# Patient Record
Sex: Female | Born: 1965 | Race: White | Hispanic: No | Smoking: Former smoker
Health system: Southern US, Community
[De-identification: ages and names within clinical notes are randomized; demographics above are authoritative.]

## PROBLEM LIST (undated history)

## (undated) DIAGNOSIS — K219 Gastro-esophageal reflux disease without esophagitis: Secondary | ICD-10-CM

## (undated) DIAGNOSIS — D649 Anemia, unspecified: Secondary | ICD-10-CM

## (undated) DIAGNOSIS — I10 Essential (primary) hypertension: Secondary | ICD-10-CM

## (undated) DIAGNOSIS — K200 Eosinophilic esophagitis: Secondary | ICD-10-CM

## (undated) DIAGNOSIS — K859 Acute pancreatitis without necrosis or infection, unspecified: Secondary | ICD-10-CM

## (undated) DIAGNOSIS — N200 Calculus of kidney: Secondary | ICD-10-CM

## (undated) HISTORY — PX: MOUTH SURGERY: SHX715

## (undated) HISTORY — PX: BREAST LUMPECTOMY: SHX2

## (undated) HISTORY — PX: ABDOMINAL HYSTERECTOMY: SHX81

## (undated) HISTORY — PX: ANKLE RECONSTRUCTION: SHX1151

## (undated) HISTORY — PX: APPENDECTOMY: SHX54

## (undated) HISTORY — PX: CHOLECYSTECTOMY: SHX55

## (undated) HISTORY — PX: TONSILLECTOMY: SUR1361

## (undated) HISTORY — PX: WRIST RECONSTRUCTION: SHX2675

---

## 1999-11-18 ENCOUNTER — Encounter: Payer: Self-pay | Admitting: Emergency Medicine

## 1999-11-18 ENCOUNTER — Emergency Department (HOSPITAL_COMMUNITY): Admission: EM | Admit: 1999-11-18 | Discharge: 1999-11-18 | Payer: Self-pay | Admitting: Emergency Medicine

## 2000-01-18 ENCOUNTER — Emergency Department (HOSPITAL_COMMUNITY): Admission: EM | Admit: 2000-01-18 | Discharge: 2000-01-18 | Payer: Self-pay | Admitting: Emergency Medicine

## 2000-01-19 ENCOUNTER — Encounter: Payer: Self-pay | Admitting: Emergency Medicine

## 2000-01-30 ENCOUNTER — Ambulatory Visit (HOSPITAL_COMMUNITY): Admission: RE | Admit: 2000-01-30 | Discharge: 2000-01-30 | Payer: Self-pay

## 2000-02-05 ENCOUNTER — Encounter: Payer: Self-pay | Admitting: Emergency Medicine

## 2000-02-05 ENCOUNTER — Emergency Department (HOSPITAL_COMMUNITY): Admission: EM | Admit: 2000-02-05 | Discharge: 2000-02-05 | Payer: Self-pay | Admitting: Emergency Medicine

## 2000-06-17 ENCOUNTER — Emergency Department (HOSPITAL_COMMUNITY): Admission: EM | Admit: 2000-06-17 | Discharge: 2000-06-17 | Payer: Self-pay | Admitting: *Deleted

## 2000-12-17 ENCOUNTER — Encounter: Payer: Self-pay | Admitting: Emergency Medicine

## 2000-12-17 ENCOUNTER — Emergency Department (HOSPITAL_COMMUNITY): Admission: EM | Admit: 2000-12-17 | Discharge: 2000-12-17 | Payer: Self-pay | Admitting: Emergency Medicine

## 2001-05-10 ENCOUNTER — Emergency Department (HOSPITAL_COMMUNITY): Admission: EM | Admit: 2001-05-10 | Discharge: 2001-05-10 | Payer: Self-pay | Admitting: Emergency Medicine

## 2002-07-07 ENCOUNTER — Encounter: Payer: Self-pay | Admitting: Unknown Physician Specialty

## 2002-07-07 ENCOUNTER — Encounter: Admission: RE | Admit: 2002-07-07 | Discharge: 2002-07-07 | Payer: Self-pay | Admitting: Unknown Physician Specialty

## 2005-05-14 HISTORY — PX: LUMBAR DISC SURGERY: SHX700

## 2006-05-14 DIAGNOSIS — N200 Calculus of kidney: Secondary | ICD-10-CM

## 2006-05-14 HISTORY — DX: Calculus of kidney: N20.0

## 2006-06-16 ENCOUNTER — Emergency Department (HOSPITAL_COMMUNITY): Admission: EM | Admit: 2006-06-16 | Discharge: 2006-06-16 | Payer: Self-pay | Admitting: Emergency Medicine

## 2006-06-19 ENCOUNTER — Emergency Department (HOSPITAL_COMMUNITY): Admission: EM | Admit: 2006-06-19 | Discharge: 2006-06-19 | Payer: Self-pay | Admitting: Emergency Medicine

## 2006-10-04 ENCOUNTER — Emergency Department (HOSPITAL_COMMUNITY): Admission: EM | Admit: 2006-10-04 | Discharge: 2006-10-04 | Payer: Self-pay | Admitting: Emergency Medicine

## 2008-02-06 ENCOUNTER — Emergency Department: Payer: Self-pay | Admitting: Emergency Medicine

## 2008-03-12 ENCOUNTER — Emergency Department: Payer: Self-pay | Admitting: Emergency Medicine

## 2008-06-28 ENCOUNTER — Ambulatory Visit: Payer: Self-pay | Admitting: Orthopedic Surgery

## 2009-01-30 ENCOUNTER — Emergency Department: Payer: Self-pay | Admitting: Unknown Physician Specialty

## 2009-09-12 ENCOUNTER — Emergency Department (HOSPITAL_COMMUNITY): Admission: EM | Admit: 2009-09-12 | Discharge: 2009-09-12 | Payer: Self-pay | Admitting: Emergency Medicine

## 2010-02-07 ENCOUNTER — Encounter
Admission: RE | Admit: 2010-02-07 | Discharge: 2010-05-08 | Payer: Self-pay | Source: Home / Self Care | Attending: Specialist | Admitting: Specialist

## 2010-07-04 ENCOUNTER — Emergency Department (HOSPITAL_COMMUNITY)
Admission: EM | Admit: 2010-07-04 | Discharge: 2010-07-04 | Disposition: A | Discharge status: Home / Self Care | Payer: Self-pay | Attending: Emergency Medicine | Admitting: Emergency Medicine

## 2010-07-04 DIAGNOSIS — S139XXA Sprain of joints and ligaments of unspecified parts of neck, initial encounter: Secondary | ICD-10-CM | POA: Insufficient documentation

## 2010-07-04 DIAGNOSIS — X500XXA Overexertion from strenuous movement or load, initial encounter: Secondary | ICD-10-CM | POA: Insufficient documentation

## 2010-07-04 DIAGNOSIS — Y92009 Unspecified place in unspecified non-institutional (private) residence as the place of occurrence of the external cause: Secondary | ICD-10-CM | POA: Insufficient documentation

## 2010-07-04 DIAGNOSIS — M25519 Pain in unspecified shoulder: Secondary | ICD-10-CM | POA: Insufficient documentation

## 2010-07-04 DIAGNOSIS — Y9383 Activity, rough housing and horseplay: Secondary | ICD-10-CM | POA: Insufficient documentation

## 2010-08-01 LAB — URINALYSIS, ROUTINE W REFLEX MICROSCOPIC
Bilirubin Urine: NEGATIVE
Glucose, UA: NEGATIVE mg/dL
Hgb urine dipstick: NEGATIVE
Ketones, ur: NEGATIVE mg/dL
Nitrite: NEGATIVE
Protein, ur: NEGATIVE mg/dL
Specific Gravity, Urine: 1.025 (ref 1.005–1.030)
Urobilinogen, UA: 0.2 mg/dL (ref 0.0–1.0)
pH: 6 (ref 5.0–8.0)

## 2011-05-24 ENCOUNTER — Ambulatory Visit: Payer: Worker's Compensation

## 2011-05-29 ENCOUNTER — Ambulatory Visit: Payer: Worker's Compensation

## 2011-11-15 ENCOUNTER — Ambulatory Visit (INDEPENDENT_AMBULATORY_CARE_PROVIDER_SITE_OTHER): Payer: 59 | Admitting: Emergency Medicine

## 2011-11-15 VITALS — BP 136/84 | HR 64 | Temp 98.5°F | Resp 16 | Ht 69.0 in | Wt 218.4 lb

## 2011-11-15 DIAGNOSIS — N390 Urinary tract infection, site not specified: Secondary | ICD-10-CM

## 2011-11-15 DIAGNOSIS — N898 Other specified noninflammatory disorders of vagina: Secondary | ICD-10-CM

## 2011-11-15 DIAGNOSIS — L293 Anogenital pruritus, unspecified: Secondary | ICD-10-CM

## 2011-11-15 LAB — POCT UA - MICROSCOPIC ONLY
Casts, Ur, LPF, POC: NEGATIVE
Crystals, Ur, HPF, POC: NEGATIVE
Mucus, UA: POSITIVE
Yeast, UA: NEGATIVE

## 2011-11-15 LAB — POCT URINALYSIS DIPSTICK
Bilirubin, UA: NEGATIVE
Blood, UA: NEGATIVE
Glucose, UA: NEGATIVE
Ketones, UA: NEGATIVE
Nitrite, UA: NEGATIVE
Protein, UA: NEGATIVE
Spec Grav, UA: 1.015
Urobilinogen, UA: 0.2
pH, UA: 6.5

## 2011-11-15 LAB — POCT WET PREP WITH KOH
KOH Prep POC: NEGATIVE
Trichomonas, UA: NEGATIVE
Yeast Wet Prep HPF POC: NEGATIVE

## 2011-11-15 MED ORDER — FLUCONAZOLE 150 MG PO TABS: 150.0000 mg | ORAL_TABLET | Freq: Once | ORAL | Status: AC

## 2011-11-15 MED ORDER — CIPROFLOXACIN HCL 250 MG PO TABS: 250.0000 mg | ORAL_TABLET | Freq: Two times a day (BID) | ORAL | Status: AC

## 2011-11-15 NOTE — Progress Notes (Signed)
Subjective:    Patient ID: Bethany West, female    DOB: [birth year], 46 y.o.   MRN: [id number]  HPI 46 year old woman presents with symptoms that started Sunday nigh,  had burning in the vaginal area and Monday had burning with urination. Used OTC medication for yeast infection, felt a little better. Had discharge only after OTC medication. Currently sexually active. Having heartburn. Not currently taking medication for heartburn    Review of Systems     Objective:   Physical Exam physical exam reveals a lot of redness at the introitus. There is redness of both vulva. Whitish discharge in the entrance to the vaginal vault discharge was sent for KOH and wet prep. Digital exam revealed no adnexal masses .   Results for orders placed in visit on 11/15/11  POCT URINALYSIS DIPSTICK      Component Value Range   Color, UA yellow     Clarity, UA cloudy     Glucose, UA neg'     Bilirubin, UA neg     Ketones, UA neg     Spec Grav, UA 1.015     Blood, UA neg     pH, UA 6.5     Protein, UA neg     Urobilinogen, UA 0.2     Nitrite, UA neg     Leukocytes, UA large (3+)    POCT UA - MICROSCOPIC ONLY      Component Value Range   WBC, Ur, HPF, POC tntc     RBC, urine, microscopic 7-10     Bacteria, U Microscopic 3+     Mucus, UA pos     Epithelial cells, urine per micros 3-5     Crystals, Ur, HPF, POC neg     Casts, Ur, LPF, POC neg     Yeast, UA neg    POCT WET PREP WITH KOH      Component Value Range   Trichomonas, UA Negative     Clue Cells Wet Prep HPF POC 2-4     Epithelial Wet Prep HPF POC 1-3     Yeast Wet Prep HPF POC neg     Bacteria Wet Prep HPF POC 1+     RBC Wet Prep HPF POC neg     WBC Wet Prep HPF POC 10-15     KOH Prep POC Negative     Results for orders placed in visit on 11/15/11  POCT URINALYSIS DIPSTICK      Component Value Range   Color, UA yellow     Clarity, UA cloudy     Glucose, UA neg'     Bilirubin, UA neg     Ketones, UA neg     Spec Grav, UA  1.015     Blood, UA neg     pH, UA 6.5     Protein, UA neg     Urobilinogen, UA 0.2     Nitrite, UA neg     Leukocytes, UA large (3+)    POCT UA - MICROSCOPIC ONLY      Component Value Range   WBC, Ur, HPF, POC tntc     RBC, urine, microscopic 7-10     Bacteria, U Microscopic 3+     Mucus, UA pos     Epithelial cells, urine per micros 3-5     Crystals, Ur, HPF, POC neg     Casts, Ur, LPF, POC neg     Yeast, UA neg    POCT  WET PREP WITH KOH      Component Value Range   Trichomonas, UA Negative     Clue Cells Wet Prep HPF POC 2-4     Epithelial Wet Prep HPF POC 1-3     Yeast Wet Prep HPF POC neg     Bacteria Wet Prep HPF POC 1+     RBC Wet Prep HPF POC neg     WBC Wet Prep HPF POC 10-15     KOH Prep POC Negative    GLUCOSE, POCT (MANUAL RESULT ENTRY)      Component Value Range   POC Glucose 83  70 - 99 mg/dl      Assessment & Plan:  Urine is abnormal but she has a good deal of inflammation around the introitus. We'll treat with Cipro for 5 days urine culture was done. She was also given Diflucan to take one dose today and repeat in one week.

## 2011-11-15 NOTE — Progress Notes (Signed)
  Subjective:    Patient ID: Bethany West, female    DOB: 1965/09/28, 46 y.o.   MRN: [id number]  HPI    Review of Systems     Objective:   Physical Exam        Assessment & Plan:

## 2011-11-17 LAB — URINE CULTURE
Colony Count: NO GROWTH
Organism ID, Bacteria: NO GROWTH

## 2011-12-02 ENCOUNTER — Emergency Department (HOSPITAL_COMMUNITY)
Admission: EM | Admit: 2011-12-02 | Discharge: 2011-12-03 | Disposition: A | Discharge status: Home / Self Care | Payer: 59 | Attending: Emergency Medicine | Admitting: Emergency Medicine

## 2011-12-02 DIAGNOSIS — X58XXXA Exposure to other specified factors, initial encounter: Secondary | ICD-10-CM | POA: Insufficient documentation

## 2011-12-02 DIAGNOSIS — S93409A Sprain of unspecified ligament of unspecified ankle, initial encounter: Secondary | ICD-10-CM | POA: Insufficient documentation

## 2011-12-02 DIAGNOSIS — F17200 Nicotine dependence, unspecified, uncomplicated: Secondary | ICD-10-CM | POA: Insufficient documentation

## 2011-12-03 ENCOUNTER — Ambulatory Visit (HOSPITAL_COMMUNITY)
Admission: RE | Admit: 2011-12-03 | Discharge: 2011-12-03 | Disposition: A | Discharge status: Home / Self Care | Payer: 59 | Source: Ambulatory Visit | Attending: Emergency Medicine | Admitting: Emergency Medicine

## 2011-12-03 ENCOUNTER — Other Ambulatory Visit (HOSPITAL_COMMUNITY): Payer: Self-pay | Admitting: Emergency Medicine

## 2011-12-03 DIAGNOSIS — R52 Pain, unspecified: Secondary | ICD-10-CM

## 2011-12-03 DIAGNOSIS — X500XXA Overexertion from strenuous movement or load, initial encounter: Secondary | ICD-10-CM | POA: Insufficient documentation

## 2011-12-03 DIAGNOSIS — S93409A Sprain of unspecified ligament of unspecified ankle, initial encounter: Secondary | ICD-10-CM | POA: Insufficient documentation

## 2011-12-03 NOTE — ED Notes (Signed)
See downtime charting. 

## 2011-12-05 MED FILL — Oxycodone w/ Acetaminophen Tab 5-325 MG: ORAL | Qty: 2 | Status: AC

## 2012-01-01 ENCOUNTER — Ambulatory Visit: Payer: Self-pay | Admitting: Internal Medicine

## 2012-05-01 ENCOUNTER — Ambulatory Visit: Payer: Self-pay | Admitting: Family Medicine

## 2012-10-15 ENCOUNTER — Other Ambulatory Visit (HOSPITAL_COMMUNITY): Payer: Self-pay | Admitting: Specialist

## 2012-10-15 DIAGNOSIS — M545 Low back pain, unspecified: Secondary | ICD-10-CM

## 2012-10-17 ENCOUNTER — Ambulatory Visit (HOSPITAL_COMMUNITY)
Admission: RE | Admit: 2012-10-17 | Discharge: 2012-10-17 | Disposition: A | Discharge status: Home / Self Care | Payer: 59 | Source: Ambulatory Visit | Attending: Specialist | Admitting: Specialist

## 2012-10-17 DIAGNOSIS — M47817 Spondylosis without myelopathy or radiculopathy, lumbosacral region: Secondary | ICD-10-CM | POA: Insufficient documentation

## 2012-10-17 DIAGNOSIS — M545 Low back pain, unspecified: Secondary | ICD-10-CM

## 2012-10-17 DIAGNOSIS — M5137 Other intervertebral disc degeneration, lumbosacral region: Secondary | ICD-10-CM | POA: Insufficient documentation

## 2012-10-17 DIAGNOSIS — M51379 Other intervertebral disc degeneration, lumbosacral region without mention of lumbar back pain or lower extremity pain: Secondary | ICD-10-CM | POA: Insufficient documentation

## 2012-10-17 MED ORDER — GADOBENATE DIMEGLUMINE 529 MG/ML IV SOLN
20.0000 mL | Freq: Once | INTRAVENOUS | Status: AC
  Administered 2012-10-17: 20 mL via INTRAVENOUS

## 2012-12-29 ENCOUNTER — Emergency Department (HOSPITAL_COMMUNITY): Payer: 59

## 2012-12-29 ENCOUNTER — Emergency Department (HOSPITAL_COMMUNITY)
Admission: EM | Admit: 2012-12-29 | Discharge: 2012-12-29 | Disposition: A | Discharge status: Home / Self Care | Payer: 59 | Attending: Emergency Medicine | Admitting: Emergency Medicine

## 2012-12-29 ENCOUNTER — Encounter (HOSPITAL_COMMUNITY): Payer: Self-pay | Admitting: Nurse Practitioner

## 2012-12-29 DIAGNOSIS — Z8719 Personal history of other diseases of the digestive system: Secondary | ICD-10-CM | POA: Insufficient documentation

## 2012-12-29 DIAGNOSIS — Z87442 Personal history of urinary calculi: Secondary | ICD-10-CM | POA: Insufficient documentation

## 2012-12-29 DIAGNOSIS — Z791 Long term (current) use of non-steroidal anti-inflammatories (NSAID): Secondary | ICD-10-CM | POA: Insufficient documentation

## 2012-12-29 DIAGNOSIS — R109 Unspecified abdominal pain: Secondary | ICD-10-CM

## 2012-12-29 DIAGNOSIS — F17200 Nicotine dependence, unspecified, uncomplicated: Secondary | ICD-10-CM | POA: Insufficient documentation

## 2012-12-29 HISTORY — DX: Calculus of kidney: N20.0

## 2012-12-29 HISTORY — DX: Acute pancreatitis without necrosis or infection, unspecified: K85.90

## 2012-12-29 LAB — CBC WITH DIFFERENTIAL/PLATELET
Basophils Relative: 0 % (ref 0–1)
Eosinophils Absolute: 0.2 10*3/uL (ref 0.0–0.7)
MCH: 27.7 pg (ref 26.0–34.0)
MCHC: 34.6 g/dL (ref 30.0–36.0)
Neutrophils Relative %: 55 % (ref 43–77)
Platelets: 272 10*3/uL (ref 150–400)

## 2012-12-29 LAB — COMPREHENSIVE METABOLIC PANEL
ALT: 13 U/L (ref 0–35)
Albumin: 3.7 g/dL (ref 3.5–5.2)
Alkaline Phosphatase: 61 U/L (ref 39–117)
Potassium: 4 mEq/L (ref 3.5–5.1)
Sodium: 137 mEq/L (ref 135–145)
Total Protein: 7 g/dL (ref 6.0–8.3)

## 2012-12-29 LAB — URINALYSIS, ROUTINE W REFLEX MICROSCOPIC
Bilirubin Urine: NEGATIVE
Hgb urine dipstick: NEGATIVE
Nitrite: NEGATIVE
Specific Gravity, Urine: 1.022 (ref 1.005–1.030)
pH: 7 (ref 5.0–8.0)

## 2012-12-29 LAB — LIPASE, BLOOD: Lipase: 34 U/L (ref 11–59)

## 2012-12-29 MED ORDER — IOHEXOL 300 MG/ML  SOLN
25.0000 mL | INTRAMUSCULAR | Status: AC
  Administered 2012-12-29: 25 mL via ORAL

## 2012-12-29 MED ORDER — IOHEXOL 300 MG/ML  SOLN
100.0000 mL | Freq: Once | INTRAMUSCULAR | Status: AC | PRN
  Administered 2012-12-29: 100 mL via INTRAVENOUS

## 2012-12-29 MED ORDER — SODIUM CHLORIDE 0.9 % IV BOLUS (SEPSIS)
500.0000 mL | Freq: Once | INTRAVENOUS | Status: AC
  Administered 2012-12-29: 500 mL via INTRAVENOUS

## 2012-12-29 NOTE — ED Notes (Signed)
Pt reports LT flank pain that started 2 weeks ago

## 2012-12-29 NOTE — ED Notes (Signed)
Ct aware pt is ready for ct scan

## 2012-12-29 NOTE — ED Provider Notes (Signed)
CSN: [id number]     Arrival date & time 12/29/12  4098 History     First MD Initiated Contact with Patient 12/29/12 [phone]     Chief Complaint  Patient presents with  . Flank Pain   (Consider location/radiation/quality/duration/timing/severity/associated sxs/prior Treatment) HPI 47 y o w f with PMH- Kidney stone, Back pain- Cervical and Lumber stenosis, and pancreatitis, presented with complaints of Lt flank pain- has been present for the past 2 weeks- intensity of 5/10, but today pain increased- intensity now- 7/10. Pt reports that the pain radiates down to her Lt groin and to her arm. No fever, no change in urine colour, no dysuria or freq,no suprapubic pain, no nausea or vomiting, no hx of trauma. Pt reports that she has had a kidney stone on the Rt side in 2008, and her mother has had 3 episodes of kidney stone. Pt also has chronic back pains, for which she takes tylenol and alieve, she takes tylenol in the morning and alieve at night, 2 tabs- sometimes more, everyday for the past 2-3 years.   Past Medical History  Diagnosis Date  . Pancreatitis   . Kidney stone    History reviewed. No pertinent past surgical history. History reviewed. No pertinent family history. History  Substance Use Topics  . Smoking status: Current Every Day Smoker  . Smokeless tobacco: Not on file  . Alcohol Use: Not on file   OB History   Grav Para Term Preterm Abortions TAB SAB Ect Mult Living                 Review of Systems No pertinent findings on ROS.  Allergies  Percocet and Sulfa antibiotics  Home Medications   Current Outpatient Rx  Name  Route  Sig  Dispense  Refill  . acetaminophen (TYLENOL) 325 MG tablet   Oral   Take 650 mg by mouth every 6 (six) hours as needed for pain.         . Multiple Vitamin (MULTI-VITAMIN DAILY PO)   Oral   Take 1 tablet by mouth daily.         . naproxen sodium (ANAPROX) 220 MG tablet   Oral   Take 220 mg by mouth 2 (two) times daily with a  meal.          BP 129/80  Pulse 80  Temp(Src) 97 F (36.1 C) (Oral)  Resp 16  Ht 5\' 10"  (1.778 m)  Wt 217 lb (98.431 kg)  BMI 31.14 kg/m2  SpO2 100%  LMP 11/13/2001 Physical Exam GENERAL- Alert, co-op, not in any distress, appears as stated age  HEENT- Atraumatic, Normocephalic, PERRL, EOMI, neck supple, no cervical lymphadenopathy.  CARDIAC- RRR, no murmurs, rubs or gallops.  RESP- moving equal volumes of air bilat, clear to ausculation bilat.  ABD- Soft, tenderness in LUQ, no rebound or guarding, no palpable, masses or organomegaly, Bowel sounds normoactive.  BACK- Normal spinal curvature,Lt CVA tenderness.  EXTREM- Pulses equal bilat, normal strenght in all extremities.  SKIN- Few punctate lesions- diffuse-1 on back, and 2 on upper extremities, most likely due to bug bite. PSYCH- Normal mood and affect, thought content and speech appropriate.  ED Course   Procedures (including critical care time)  Labs Reviewed  CBC WITH DIFFERENTIAL - Abnormal; Notable for the following:    HCT 35.8 (*)    All other components within normal limits  COMPREHENSIVE METABOLIC PANEL - Abnormal; Notable for the following:    Total Bilirubin 0.2 (*)  All other components within normal limits  LIPASE, BLOOD  URINALYSIS, ROUTINE W REFLEX MICROSCOPIC   Ct Abdomen Pelvis W Contrast  12/29/2012   *RADIOLOGY REPORT*  Clinical Data: Left upper quadrant pain for the past month with nausea. History of appendectomy, cholecystectomy and hysterectomy.  CT ABDOMEN AND PELVIS WITH CONTRAST  Technique:  Multidetector CT imaging of the abdomen and pelvis was performed following the standard protocol during bolus administration of intravenous contrast.  Contrast: OMNIPAQUE IOHEXOL 300 MG/ML  SOLN  Comparison: 09/12/2009 CT.  Findings: No extraluminal bowel inflammatory process, free fluid or free air.  Post appendectomy.  Enlarged fatty infiltrated liver spanning over 19 cm without focal mass.  Post  cholecystectomy.  No worrisome splenic, pancreatic, adrenal or renal lesion.  No findings to suggest pancreatic inflammation.  Post hysterectomy.  Left ovarian 4.2 cm septated cyst versus 2 cysts side-by-side.  No bony destructive lesion.  Degenerative changes most notable lower lumbar region.  Lung bases clear.  No abdominal aortic aneurysm.  No adenopathy.  Noncontrast filled partially contracted urinary bladder unremarkable.  IMPRESSION: No extraluminal bowel inflammatory process, free fluid or free air. Post appendectomy.  Enlarged fatty infiltrated liver spanning over 19 cm without focal mass.  Post cholecystectomy.  No findings to suggest pancreatic inflammation.  Post hysterectomy.  Left ovarian 4.2 cm septated cyst versus 2 cysts side-by-side.   Original Report Authenticated By: Lacy Duverney, M.D.   1. Lt flank pain     MDM  Lt flank pain- Considering hx of renal stone, with pt reporting pain radiating to the groin. Considerations were for nephrolitiasis, and a UTI. - Urinalysis was done which was normal, also Abdominal Ct w contrast was done which showed no renal pathology, or any acute pathology accounting for the pts flank pain. Pancreatitis was also a consideration, as pt reported a hx of pancreatitis and presented with some LUQ pain, but Lipase was normal, and Abd Ct was normal. - CBC and BMP were essentially Normal.  - Preg test. - Pt was discharged home to follow up with a PCP, said she didn't want opioid based medications - Like lortab,  for pain control, pt was counselled on use of NSAIDS and potentitial for nephrotoxicity. Pt verbalized understanding.     Kennis Carina, MD 12/29/12 4098  Kennis Carina, MD 12/29/12 1191

## 2012-12-30 NOTE — ED Provider Notes (Signed)
Swelling/abdominal pain. Lab work and CT reassuring. Patient does not want narcotic pain medicines. Will be discharged home. I saw and evaluated the patient, reviewed the resident's note and I agree with the findings and plan.  Juliet Rude. Rubin Payor, MD 12/30/12 1534

## 2013-03-19 ENCOUNTER — Other Ambulatory Visit: Payer: Self-pay

## 2016-05-14 HISTORY — PX: KNEE ARTHROSCOPY: SHX127

## 2016-11-17 ENCOUNTER — Encounter (HOSPITAL_BASED_OUTPATIENT_CLINIC_OR_DEPARTMENT_OTHER): Payer: Self-pay | Admitting: Emergency Medicine

## 2016-11-17 ENCOUNTER — Emergency Department (HOSPITAL_BASED_OUTPATIENT_CLINIC_OR_DEPARTMENT_OTHER): Payer: Worker's Compensation

## 2016-11-17 ENCOUNTER — Emergency Department (HOSPITAL_BASED_OUTPATIENT_CLINIC_OR_DEPARTMENT_OTHER)
Admission: EM | Admit: 2016-11-17 | Discharge: 2016-11-17 | Disposition: A | Discharge status: Home / Self Care | Payer: Worker's Compensation | Attending: Emergency Medicine | Admitting: Emergency Medicine

## 2016-11-17 DIAGNOSIS — Y9389 Activity, other specified: Secondary | ICD-10-CM | POA: Diagnosis not present

## 2016-11-17 DIAGNOSIS — Y9289 Other specified places as the place of occurrence of the external cause: Secondary | ICD-10-CM | POA: Insufficient documentation

## 2016-11-17 DIAGNOSIS — Y990 Civilian activity done for income or pay: Secondary | ICD-10-CM | POA: Diagnosis not present

## 2016-11-17 DIAGNOSIS — F17200 Nicotine dependence, unspecified, uncomplicated: Secondary | ICD-10-CM | POA: Diagnosis not present

## 2016-11-17 DIAGNOSIS — W19XXXA Unspecified fall, initial encounter: Secondary | ICD-10-CM | POA: Insufficient documentation

## 2016-11-17 DIAGNOSIS — M25511 Pain in right shoulder: Secondary | ICD-10-CM | POA: Insufficient documentation

## 2016-11-17 DIAGNOSIS — Z79899 Other long term (current) drug therapy: Secondary | ICD-10-CM | POA: Insufficient documentation

## 2016-11-17 MED ORDER — IBUPROFEN 800 MG PO TABS: 800.0000 mg | ORAL_TABLET | Freq: Three times a day (TID) | ORAL | 0 refills | Status: DC | PRN

## 2016-11-17 MED ORDER — ONDANSETRON HCL 4 MG/2ML IJ SOLN
4.0000 mg | Freq: Once | INTRAMUSCULAR | Status: AC
  Administered 2016-11-17: 4 mg via INTRAVENOUS

## 2016-11-17 MED ORDER — ONDANSETRON HCL 4 MG/2ML IJ SOLN
INTRAMUSCULAR | Status: AC
  Filled 2016-11-17: qty 2

## 2016-11-17 MED ORDER — METHOCARBAMOL 500 MG PO TABS: 500.0000 mg | ORAL_TABLET | Freq: Two times a day (BID) | ORAL | 0 refills | Status: DC | PRN

## 2016-11-17 NOTE — ED Provider Notes (Signed)
[facility]   CSN: [id number] Arrival date & time: 11/17/16  [id number]     History   Chief Complaint Chief Complaint  Patient presents with  . Fall    HPI Ival Jilda PandaS Yearwood is a 51 y.o. female.  The history is provided by the patient and medical records. No language interpreter was used.  Fall    Ignacia Fellingammie S Petrea is a 51 y.o. female  with a PMH of pancreatitis, prior kidney stones who presents to the Emergency Department complaining of acute onset of aching, tearing right shoulder pain which occurred 2/2 fall just prior to arrival. Patient states that she was putting together a table when the leg snapped back shut and caused her to fall against a nearby wall. She felt her shoulder "pop and get stuck" for a few seconds. She took her left, unaffected, arm and lifted her right arm up, causing another subsequent pop and she believes she popped shoulder back into place. She stills feels A tearing pain to the lateral and posterior aspects of her shoulder. She states this happened one time about 20 years ago, but she has not had any problems with shoulder popping out of place since then. [facility]. She received 150 MCG of fentanyl prior to arrival with moderate relief of pain. She denies head injury, loss of consciousness or neck pain. No open wounds. Pain is worse with movement and better when she keeps it still. No numbness or tingling.  Past Medical History:  Diagnosis Date  . Kidney stone   . Pancreatitis     There are no active problems to display for this patient.   History reviewed. No pertinent surgical history.  OB History    No data available       Home Medications    Prior to Admission medications   Medication Sig Start Date End Date Taking? Authorizing Provider  amLODipine (NORVASC) 5 MG tablet Take 5 mg by mouth daily.   Yes [provider]  cetirizine (ZYRTEC) 10 MG tablet Take 10 mg by  mouth daily.   Yes [provider]  acetaminophen (TYLENOL) 325 MG tablet Take 650 mg by mouth every 6 (six) hours as needed for pain.    [provider]  ibuprofen (ADVIL,MOTRIN) 800 MG tablet Take 1 tablet (800 mg total) by mouth every 8 (eight) hours as needed. 11/17/16   Ward, Chase PicketJaime Pilcher, PA-C  methocarbamol (ROBAXIN) 500 MG tablet Take 1 tablet (500 mg total) by mouth 2 (two) times daily as needed for muscle spasms. 11/17/16   Ward, Chase PicketJaime Pilcher, PA-C  Multiple Vitamin (MULTI-VITAMIN DAILY PO) Take 1 tablet by mouth daily.    [provider]  naproxen sodium (ANAPROX) 220 MG tablet Take 220 mg by mouth 2 (two) times daily with a meal.    [provider]    Family History No family history on file.  Social History Social History  Substance Use Topics  . Smoking status: Current Every Day Smoker  . Smokeless tobacco: Not on file  . Alcohol use Not on file     Allergies   Codeine; Percocet [oxycodone-acetaminophen]; and Sulfa antibiotics   Review of Systems Review of Systems  Musculoskeletal: Positive for arthralgias and myalgias. Negative for neck pain.  All other systems reviewed and are negative.    Physical Exam Updated Vital Signs BP (!) 106/59 (BP Location: Left Arm)   Pulse 82   Temp 97.6 F (36.4  C) (Oral)   Resp 18   Ht 5\' 10"  (1.778 m)   Wt 94.8 kg (209 lb)   LMP 11/13/2001   SpO2 100%   BMI 29.99 kg/m   Physical Exam  Constitutional: She is oriented to person, place, and time. She appears well-developed and well-nourished. No distress.  HENT:  Head: Normocephalic and atraumatic.  Neck: Normal range of motion. Neck supple.  Cardiovascular: Normal rate, regular rhythm and normal heart sounds.   No murmur heard. Pulmonary/Chest: Effort normal and breath sounds normal. No respiratory distress.  Musculoskeletal:  Right shoulder with tenderness to palpation posteriorly and along deltoid. Full ROM although with  pain/guarding.  Negative empty can test. No swelling, erythema or ecchymosis present. No step-off, crepitus, or deformity appreciated. 5/5 muscle strength of  Bilateral UE's. No tenderness and full ROM of elbow/wrist/hand. 2+ radial pulse, sensation intact and all compartments soft. No midline spinal tenderness and neck with full ROM.  Neurological: She is alert and oriented to person, place, and time.  Skin: Skin is warm and dry.  Nursing note and vitals reviewed.    ED Treatments / Results  Labs (all labs ordered are listed, but only abnormal results are displayed) Labs Reviewed - No data to display  EKG  EKG Interpretation None       Radiology Dg Shoulder Right  Result Date: 11/17/2016 CLINICAL DATA:  Pain after fall EXAM: RIGHT SHOULDER - 2+ VIEW COMPARISON:  None. FINDINGS: There is no evidence of fracture or dislocation. There is no evidence of arthropathy or other focal bone abnormality. Soft tissues are unremarkable. IMPRESSION: Negative. Electronically Signed   By: Gerome Sam III M.D   On: 11/17/2016 10:06    Procedures Procedures (including critical care time)  Medications Ordered in ED Medications  ondansetron [facility]) injection 4 mg (4 mg Intravenous Given 11/17/16 1018)     Initial Impression / Assessment and Plan / ED Course  I have reviewed the triage vital signs and the nursing notes.  Pertinent labs & imaging results that were available during my care of the patient were reviewed by me and considered in my medical decision making (see chart for details).    KERILYN CORTNER is a 51 y.o. female who presents to ED for acute onset of right shoulder pain after falling against a wall just prior to arrival. She felt as if her shoulder popped out of place and got stuck for a few seconds, but she was about to get it back in place. This has happened one time before > 20 years ago. No recent injury. No LOC or head injury. No midline tenderness. RUE is NVI. X-ray  negative. She is followed by [facility]. Will place in sling and have patient follow up with ortho if symptoms are not improving with symptomatic home care over the weekend. Return precautions discussed and all questions answered.   Final Clinical Impressions(s) / ED Diagnoses   Final diagnoses:  Acute pain of right shoulder  Fall, initial encounter    New Prescriptions Discharge Medication List as of 11/17/2016 10:48 AM    START taking these medications   Details  ibuprofen (ADVIL,MOTRIN) 800 MG tablet Take 1 tablet (800 mg total) by mouth every 8 (eight) hours as needed., Starting Sat 11/17/2016, Print    methocarbamol (ROBAXIN) 500 MG tablet Take 1 tablet (500 mg total) by mouth 2 (two) times daily as needed for muscle spasms., Starting Sat 11/17/2016, Print         Ward,  Chase Picket, PA-C 11/17/16 1121    Melene Plan, DO 11/17/16 [phone]

## 2016-11-17 NOTE — ED Triage Notes (Signed)
Patient was at work fell and hurt her right shoulder. Reports that she dislocated her right shoulder and popped it back into place. Came in via ambulance. Has a 20 gauge to the right hand and was given 150 mcg of Fentanyl en route to facility. Patient came in with C- collar and right sling - Patient took C- collar off on own refusing to wear it any further

## 2016-11-17 NOTE — ED Notes (Signed)
Patient denies any LOC with fall earlier. The patient is awake, alert and oriented at this time

## 2016-11-17 NOTE — Discharge Instructions (Signed)
It was my pleasure taking care of you today!  Ibuprofen as needed for pain. Robaxin is your muscle relaxer to take twice a day as needed. Ice shoulder throughout the day (instructions below).  Wear shoulder sling for no more than 3 days, then begin performing gentle range of motion exercises.   Call your orthopedist listed if symptoms are not improved on Monday to schedule a follow up appointment.   Return to the ER for new or worsening symptoms, any additional concerns.  COLD THERAPY DIRECTIONS:  Ice or gel packs can be used to reduce both pain and swelling. Ice is the most helpful within the first 24 to 48 hours after an injury or flareup from overusing a muscle or joint.  Ice is effective, has very few side effects, and is safe for most people to use.   If you expose your skin to cold temperatures for too long or without the proper protection, you can damage your skin or nerves. Watch for signs of skin damage due to cold.   HOME CARE INSTRUCTIONS  Follow these tips to use ice and cold packs safely.  Place a dry or damp towel between the ice and skin. A damp towel will cool the skin more quickly, so you may need to shorten the time that the ice is used.  For a more rapid response, add gentle compression to the ice.  Ice for no more than 10 to 20 minutes at a time. The bonier the area you are icing, the less time it will take to get the benefits of ice.  Check your skin after 5 minutes to make sure there are no signs of a poor response to cold or skin damage.  Rest 20 minutes or more in between uses.  Once your skin is numb, you can end your treatment. You can test numbness by very lightly touching your skin. The touch should be so light that you do not see the skin dimple from the pressure of your fingertip. When using ice, most people will feel these normal sensations in this order: cold, burning, aching, and numbness.

## 2018-07-22 ENCOUNTER — Ambulatory Visit: Payer: BLUE CROSS/BLUE SHIELD | Admitting: Plastic Surgery

## 2018-07-22 ENCOUNTER — Encounter: Payer: Self-pay | Admitting: Plastic Surgery

## 2018-07-22 VITALS — BP 130/79 | HR 87 | Temp 98.4°F | Ht 69.0 in | Wt 218.0 lb

## 2018-07-22 DIAGNOSIS — G8929 Other chronic pain: Secondary | ICD-10-CM | POA: Insufficient documentation

## 2018-07-22 DIAGNOSIS — M546 Pain in thoracic spine: Secondary | ICD-10-CM

## 2018-07-22 DIAGNOSIS — M542 Cervicalgia: Secondary | ICD-10-CM

## 2018-07-22 DIAGNOSIS — N62 Hypertrophy of breast: Secondary | ICD-10-CM | POA: Diagnosis not present

## 2018-07-22 NOTE — Progress Notes (Signed)
Patient ID: Bethany West, female    DOB: 10/28/1965, 53 y.o.   MRN: [id number]   Chief Complaint  Patient presents with  . Breast Problem    Mammary Hyperplasia: The patient is a 53 y.o. female with a history of mammary hyperplasia for several years.  She has extremely large breasts causing symptoms that include the following: Back pain (upper and lower) and neck pain. She frequently pins bra cups higher on straps for better lift and relief. Notices relief when holding breast up in her hands. Shoulder straps causing grooves, pain occasionally requiring padding. Pain medication is sometimes required with motrin and tylenol.  Activities that are hindered by enlarged breasts include: Exercise, running, heavy lifting.  She has had chronic pain particularly in her back between her shoulder blades.  She saw Dr. Shon Baton and he strongly felt that it was the size of her breasts that were the problem.  She has had a discectomy in the past when she was living in [place].  She has chronic back pain.  She is actively trying to lose weight and is on medication.  Her breasts are extremely large and fairly symmetric.  She has hyperpigmentation of the inframammary area on both sides.  The sternal to nipple distance on the right is 34 cm and the left is 34 cm.  The IMF distance is 14 cm.  She is 5 feet 9 inches tall and weighs 218 pounds.  Preoperative bra size = 40 H cup.  She would like to be a D cup the estimated excess breast tissue to be removed at the time of surgery = 850 grams on the left and 850 grams on the right.  Mammogram history: Mammogram was done in December and was negative.  Patient will have results sent to [place].   Review of Systems  Constitutional: Positive for activity change. Negative for appetite change.  HENT: Negative.   Eyes: Negative.   Respiratory: Negative.   Cardiovascular: Negative.  Negative for leg swelling.  Gastrointestinal: Negative.  Negative for abdominal pain.    Endocrine: Negative.   Genitourinary: Negative.   Musculoskeletal: Positive for back pain and neck pain.  Skin: Negative for color change and wound.  Hematological: Negative.   Psychiatric/Behavioral: Negative.     Past Medical History:  Diagnosis Date  . Kidney stone   . Pancreatitis     History reviewed. No pertinent surgical history.    Current Outpatient Medications:  .  Naltrexone-buPROPion HCl ER (CONTRAVE) 8-90 MG TB12, Take by mouth., Disp: , Rfl:  .  acetaminophen (TYLENOL) 325 MG tablet, Take 650 mg by mouth every 6 (six) hours as needed for pain., Disp: , Rfl:  .  amLODipine (NORVASC) 5 MG tablet, Take 5 mg by mouth daily., Disp: , Rfl:  .  cetirizine (ZYRTEC) 10 MG tablet, Take 10 mg by mouth daily., Disp: , Rfl:  .  ibuprofen (ADVIL,MOTRIN) 800 MG tablet, Take 1 tablet (800 mg total) by mouth every 8 (eight) hours as needed., Disp: 21 tablet, Rfl: 0 .  methocarbamol (ROBAXIN) 500 MG tablet, Take 1 tablet (500 mg total) by mouth 2 (two) times daily as needed for muscle spasms. (Patient not taking: Reported on 07/22/2018), Disp: 12 tablet, Rfl: 0 .  Multiple Vitamin (MULTI-VITAMIN DAILY PO), Take 1 tablet by mouth daily., Disp: , Rfl:  .  naproxen sodium (ANAPROX) 220 MG tablet, Take 220 mg by mouth 2 (two) times daily with a meal., Disp: , Rfl:  Objective:   Vitals:   07/22/18 0948  BP: 130/79  Pulse: 87  Temp: 98.4 F (36.9 C)  SpO2: 99%    Physical Exam Vitals signs and nursing note reviewed.  Constitutional:      Appearance: Normal appearance.  HENT:     Head: Normocephalic and atraumatic.     Nose: Nose normal.     Mouth/Throat:     Mouth: Mucous membranes are moist.  Eyes:     Extraocular Movements: Extraocular movements intact.     Pupils: Pupils are equal, round, and reactive to light.  Neck:     Musculoskeletal: Normal range of motion. Muscular tenderness present.  Cardiovascular:     Rate and Rhythm: Normal rate.  Pulmonary:     Effort:  Pulmonary effort is normal.  Abdominal:     General: Abdomen is flat. There is no distension.     Tenderness: There is no abdominal tenderness. There is no guarding.  Musculoskeletal:        General: No swelling.     Right lower leg: No edema.     Left lower leg: No edema.  Skin:    General: Skin is warm.     Capillary Refill: Capillary refill takes less than 2 seconds.  Neurological:     General: No focal deficit present.     Mental Status: She is alert.  Psychiatric:        Mood and Affect: Mood normal.        Thought Content: Thought content normal.        Judgment: Judgment normal.     Assessment & Plan:  Symptomatic mammary hypertrophy  Chronic bilateral thoracic back pain  Neck pain Need results of the last mammogram.  Recommend bilateral breast reduction. We also discussed the need for decreasing sugars and carbs and recommend high-protein diet for purposes of healing after surgery.  Alena Bills Kambri Dismore, DO

## 2018-09-24 ENCOUNTER — Telehealth: Payer: Self-pay | Admitting: Plastic Surgery

## 2018-09-24 NOTE — Telephone Encounter (Signed)
Called patient to confirm appointment scheduled for tomorrow. Patient answered the following questions: °1.Has the patient traveled outside of the state of [place] at all within the past 6 weeks? No °2.Does the patient have a fever or cough at all? No °3.Has the patient been tested for COVID? Had a positive COVID test? No °4. Has the patient been in contact with anyone who has tested positive? No ° °

## 2018-09-25 ENCOUNTER — Ambulatory Visit (INDEPENDENT_AMBULATORY_CARE_PROVIDER_SITE_OTHER): Payer: BLUE CROSS/BLUE SHIELD | Admitting: Plastic Surgery

## 2018-09-25 ENCOUNTER — Encounter: Payer: Self-pay | Admitting: Plastic Surgery

## 2018-09-25 ENCOUNTER — Other Ambulatory Visit: Payer: Self-pay

## 2018-09-25 ENCOUNTER — Encounter (HOSPITAL_BASED_OUTPATIENT_CLINIC_OR_DEPARTMENT_OTHER): Payer: Self-pay

## 2018-09-25 VITALS — BP 121/74 | HR 60 | Temp 98.1°F | Ht 69.0 in | Wt 225.0 lb

## 2018-09-25 DIAGNOSIS — M546 Pain in thoracic spine: Secondary | ICD-10-CM

## 2018-09-25 DIAGNOSIS — N62 Hypertrophy of breast: Secondary | ICD-10-CM

## 2018-09-25 DIAGNOSIS — G8929 Other chronic pain: Secondary | ICD-10-CM

## 2018-09-25 DIAGNOSIS — M542 Cervicalgia: Secondary | ICD-10-CM

## 2018-09-25 MED ORDER — ONDANSETRON HCL 4 MG PO TABS: 4.0000 mg | ORAL_TABLET | Freq: Three times a day (TID) | ORAL | 0 refills | Status: AC | PRN

## 2018-09-25 MED ORDER — CLOBETASOL PROPIONATE 0.05 % EX CREA: 1.0000 "application " | TOPICAL_CREAM | Freq: Two times a day (BID) | CUTANEOUS | 0 refills | Status: DC

## 2018-09-25 MED ORDER — HYDROCODONE-ACETAMINOPHEN 5-325 MG PO TABS: 1.0000 | ORAL_TABLET | Freq: Two times a day (BID) | ORAL | 0 refills | Status: AC | PRN

## 2018-09-25 MED ORDER — CEPHALEXIN 500 MG PO CAPS: 500.0000 mg | ORAL_CAPSULE | Freq: Four times a day (QID) | ORAL | 0 refills | Status: AC

## 2018-09-25 NOTE — Progress Notes (Signed)
Patient ID: Bethany West, female    DOB: 06-15-1965, 53 y.o.   MRN: [id number]   Chief Complaint  Patient presents with  . Breast Problem    The patient is a 53 year old female here for her history and physical for a bilateral breast reduction.  She has been dealing with the excess weight of her breasts for many years.  She has even undergone a discectomy in the past for the chronic back and neck pain.  She is 5 feet 9 inches tall and weighs 225 pounds.  Preoperative bra is a 40H.  She would like to be around a D cup.  Her mammogram within the past year was negative.  She is not had any recent illnesses and she has been very good about sheltering at home.  She has a slight redness between her breasts and around her panties that may be a heat rash.   Review of Systems  Constitutional: Negative.  Negative for activity change and appetite change.  Eyes: Negative.   Respiratory: Negative.   Cardiovascular: Negative.   Gastrointestinal: Negative.  Negative for abdominal distention and abdominal pain.  Genitourinary: Negative.   Musculoskeletal: Positive for back pain and neck pain.  Skin: Positive for rash.  Psychiatric/Behavioral: Negative.     Past Medical History:  Diagnosis Date  . Kidney stone   . Pancreatitis     History reviewed. No pertinent surgical history.    Current Outpatient Medications:  .  acetaminophen (TYLENOL) 325 MG tablet, Take 650 mg by mouth every 6 (six) hours as needed for pain., Disp: , Rfl:  .  amLODipine (NORVASC) 5 MG tablet, Take 5 mg by mouth daily., Disp: , Rfl:  .  cetirizine (ZYRTEC) 10 MG tablet, Take 10 mg by mouth daily., Disp: , Rfl:  .  ibuprofen (ADVIL,MOTRIN) 800 MG tablet, Take 1 tablet (800 mg total) by mouth every 8 (eight) hours as needed., Disp: 21 tablet, Rfl: 0 .  methocarbamol (ROBAXIN) 500 MG tablet, Take 1 tablet (500 mg total) by mouth 2 (two) times daily as needed for muscle spasms. (Patient not taking: Reported on  07/22/2018), Disp: 12 tablet, Rfl: 0 .  Multiple Vitamin (MULTI-VITAMIN DAILY PO), Take 1 tablet by mouth daily., Disp: , Rfl:  .  Naltrexone-buPROPion HCl ER (CONTRAVE) 8-90 MG TB12, Take by mouth., Disp: , Rfl:  .  naproxen sodium (ANAPROX) 220 MG tablet, Take 220 mg by mouth 2 (two) times daily with a meal., Disp: , Rfl:    Objective:   Vitals:   09/25/18 1007  BP: 121/74  Pulse: 60  Temp: 98.1 F (36.7 C)  SpO2: 100%    Physical Exam Vitals signs and nursing note reviewed.  Constitutional:      Appearance: Normal appearance.  HENT:     Head: Normocephalic and atraumatic.     Nose: Nose normal.     Mouth/Throat:     Mouth: Mucous membranes are moist.  Cardiovascular:     Rate and Rhythm: Normal rate.     Pulses: Normal pulses.  Abdominal:     General: Abdomen is flat. There is no distension.    Neurological:     General: No focal deficit present.     Mental Status: She is alert and oriented to person, place, and time.  Psychiatric:        Mood and Affect: Mood normal.        Behavior: Behavior normal.     Assessment & Plan:  Symptomatic mammary hypertrophy  Neck pain  Chronic bilateral thoracic back pain  Prescription sent into the pharmacy.  Plan for bilateral breast reduction. The risk that can be encountered with breast reduction were discussed and include the following but not limited to these:  Breast asymmetry, fluid accumulation, firmness of the breast, inability to breast feed, loss of nipple or areola, skin loss, decrease or no nipple sensation, fat necrosis of the breast tissue, bleeding, infection, healing delay.  There are risks of anesthesia, changes to skin sensation and injury to nerves or blood vessels.  The muscle can be temporarily or permanently injured.  You may have an allergic reaction to tape, suture, glue, blood products which can result in skin discoloration, swelling, pain, skin lesions, poor healing.  Any of these can lead to the need for  revisonal surgery or stage procedures.  A reduction has potential to interfere with diagnostic procedures.  Nipple or breast piercing can increase risks of infection.  This procedure is best done when the breast is fully developed.  Changes in the breast will continue to occur over time.  Pregnancy can alter the outcomes of previous breast reduction surgery, weight gain and weigh loss can also effect the long term appearance.    Alena Billslaire S Dannel Rafter, DO

## 2018-09-25 NOTE — H&P (View-Only) (Signed)
Patient ID: Bethany West, female    DOB: 06-15-1965, 53 y.o.   MRN: [id number]   Chief Complaint  Patient presents with  . Breast Problem    The patient is a 53 year old female here for her history and physical for a bilateral breast reduction.  She has been dealing with the excess weight of her breasts for many years.  She has even undergone a discectomy in the past for the chronic back and neck pain.  She is 5 feet 9 inches tall and weighs 225 pounds.  Preoperative bra is a 40H.  She would like to be around a D cup.  Her mammogram within the past year was negative.  She is not had any recent illnesses and she has been very good about sheltering at home.  She has a slight redness between her breasts and around her panties that may be a heat rash.   Review of Systems  Constitutional: Negative.  Negative for activity change and appetite change.  Eyes: Negative.   Respiratory: Negative.   Cardiovascular: Negative.   Gastrointestinal: Negative.  Negative for abdominal distention and abdominal pain.  Genitourinary: Negative.   Musculoskeletal: Positive for back pain and neck pain.  Skin: Positive for rash.  Psychiatric/Behavioral: Negative.     Past Medical History:  Diagnosis Date  . Kidney stone   . Pancreatitis     History reviewed. No pertinent surgical history.    Current Outpatient Medications:  .  acetaminophen (TYLENOL) 325 MG tablet, Take 650 mg by mouth every 6 (six) hours as needed for pain., Disp: , Rfl:  .  amLODipine (NORVASC) 5 MG tablet, Take 5 mg by mouth daily., Disp: , Rfl:  .  cetirizine (ZYRTEC) 10 MG tablet, Take 10 mg by mouth daily., Disp: , Rfl:  .  ibuprofen (ADVIL,MOTRIN) 800 MG tablet, Take 1 tablet (800 mg total) by mouth every 8 (eight) hours as needed., Disp: 21 tablet, Rfl: 0 .  methocarbamol (ROBAXIN) 500 MG tablet, Take 1 tablet (500 mg total) by mouth 2 (two) times daily as needed for muscle spasms. (Patient not taking: Reported on  07/22/2018), Disp: 12 tablet, Rfl: 0 .  Multiple Vitamin (MULTI-VITAMIN DAILY PO), Take 1 tablet by mouth daily., Disp: , Rfl:  .  Naltrexone-buPROPion HCl ER (CONTRAVE) 8-90 MG TB12, Take by mouth., Disp: , Rfl:  .  naproxen sodium (ANAPROX) 220 MG tablet, Take 220 mg by mouth 2 (two) times daily with a meal., Disp: , Rfl:    Objective:   Vitals:   09/25/18 1007  BP: 121/74  Pulse: 60  Temp: 98.1 F (36.7 C)  SpO2: 100%    Physical Exam Vitals signs and nursing note reviewed.  Constitutional:      Appearance: Normal appearance.  HENT:     Head: Normocephalic and atraumatic.     Nose: Nose normal.     Mouth/Throat:     Mouth: Mucous membranes are moist.  Cardiovascular:     Rate and Rhythm: Normal rate.     Pulses: Normal pulses.  Abdominal:     General: Abdomen is flat. There is no distension.    Neurological:     General: No focal deficit present.     Mental Status: She is alert and oriented to person, place, and time.  Psychiatric:        Mood and Affect: Mood normal.        Behavior: Behavior normal.     Assessment & Plan:  Symptomatic mammary hypertrophy  Neck pain  Chronic bilateral thoracic back pain  Prescription sent into the pharmacy.  Plan for bilateral breast reduction. The risk that can be encountered with breast reduction were discussed and include the following but not limited to these:  Breast asymmetry, fluid accumulation, firmness of the breast, inability to breast feed, loss of nipple or areola, skin loss, decrease or no nipple sensation, fat necrosis of the breast tissue, bleeding, infection, healing delay.  There are risks of anesthesia, changes to skin sensation and injury to nerves or blood vessels.  The muscle can be temporarily or permanently injured.  You may have an allergic reaction to tape, suture, glue, blood products which can result in skin discoloration, swelling, pain, skin lesions, poor healing.  Any of these can lead to the need for  revisonal surgery or stage procedures.  A reduction has potential to interfere with diagnostic procedures.  Nipple or breast piercing can increase risks of infection.  This procedure is best done when the breast is fully developed.  Changes in the breast will continue to occur over time.  Pregnancy can alter the outcomes of previous breast reduction surgery, weight gain and weigh loss can also effect the long term appearance.    Claire S Dillingham, DO 

## 2018-09-26 ENCOUNTER — Encounter (HOSPITAL_BASED_OUTPATIENT_CLINIC_OR_DEPARTMENT_OTHER)
Admission: RE | Admit: 2018-09-26 | Discharge: 2018-09-26 | Disposition: A | Discharge status: Home / Self Care | Payer: BLUE CROSS/BLUE SHIELD | Source: Ambulatory Visit | Attending: Plastic Surgery | Admitting: Plastic Surgery

## 2018-09-26 DIAGNOSIS — Z01818 Encounter for other preprocedural examination: Secondary | ICD-10-CM | POA: Diagnosis present

## 2018-09-29 ENCOUNTER — Other Ambulatory Visit (HOSPITAL_COMMUNITY)
Admission: RE | Admit: 2018-09-29 | Discharge: 2018-09-29 | Disposition: A | Discharge status: Home / Self Care | Payer: BLUE CROSS/BLUE SHIELD | Source: Ambulatory Visit | Attending: Plastic Surgery | Admitting: Plastic Surgery

## 2018-09-29 DIAGNOSIS — Z1159 Encounter for screening for other viral diseases: Secondary | ICD-10-CM | POA: Insufficient documentation

## 2018-09-29 DIAGNOSIS — Z01812 Encounter for preprocedural laboratory examination: Secondary | ICD-10-CM | POA: Insufficient documentation

## 2018-09-30 ENCOUNTER — Encounter: Payer: Self-pay | Admitting: Plastic Surgery

## 2018-09-30 LAB — NOVEL CORONAVIRUS, NAA (HOSP ORDER, SEND-OUT TO REF LAB; TAT 18-24 HRS): SARS-CoV-2, NAA: NOT DETECTED

## 2018-10-01 ENCOUNTER — Encounter (HOSPITAL_BASED_OUTPATIENT_CLINIC_OR_DEPARTMENT_OTHER): Payer: Self-pay | Admitting: Anesthesiology

## 2018-10-01 NOTE — Anesthesia Preprocedure Evaluation (Addendum)
Anesthesia Evaluation  Patient identified by MRN, date of birth, ID band Patient awake    Reviewed: Allergy & Precautions, NPO status , Patient's Chart, lab work & pertinent test results  Airway Mallampati: II  TM Distance: >3 FB Neck ROM: Full    Dental  (+) Lower Dentures, Upper Dentures   Pulmonary former smoker,    Pulmonary exam normal breath sounds clear to auscultation       Cardiovascular hypertension, Pt. on medications Normal cardiovascular exam Rhythm:Regular Rate:Normal     Neuro/Psych negative neurological ROS  negative psych ROS   GI/Hepatic Neg liver ROS, GERD  Medicated and Controlled,Hx/o pancreatitis Hx/o eosinophilic esophagitis   Endo/Other  Obesity Symptomatic Bilateral Mammary hypertrophy  Renal/GU Renal diseaseHx/o Renal calculi  negative genitourinary   Musculoskeletal Neck pain Bilateral thoracic back pain   Abdominal (+) + obese,   Peds  Hematology  (+) anemia ,   Anesthesia Other Findings   Reproductive/Obstetrics                           Anesthesia Physical Anesthesia Plan  ASA: II  Anesthesia Plan: General   Post-op Pain Management:    Induction: Intravenous  PONV Risk Score and Plan: 4 or greater and Scopolamine patch - Pre-op, Midazolam, Ondansetron, Dexamethasone and Treatment may vary due to age or medical condition  Airway Management Planned: Oral ETT  Additional Equipment:   Intra-op Plan:   Post-operative Plan: Extubation in OR  Informed Consent: I have reviewed the patients History and Physical, chart, labs and discussed the procedure including the risks, benefits and alternatives for the proposed anesthesia with the patient or authorized representative who has indicated his/her understanding and acceptance.     Dental advisory given  Plan Discussed with: CRNA and Surgeon  Anesthesia Plan Comments:        Anesthesia Quick  Evaluation

## 2018-10-02 ENCOUNTER — Ambulatory Visit (HOSPITAL_BASED_OUTPATIENT_CLINIC_OR_DEPARTMENT_OTHER)
Admission: RE | Admit: 2018-10-02 | Discharge: 2018-10-02 | Disposition: A | Discharge status: Home / Self Care | Payer: BLUE CROSS/BLUE SHIELD | Attending: Plastic Surgery | Admitting: Plastic Surgery

## 2018-10-02 ENCOUNTER — Encounter (HOSPITAL_BASED_OUTPATIENT_CLINIC_OR_DEPARTMENT_OTHER)
Admission: RE | Disposition: A | Discharge status: Home / Self Care | Payer: Self-pay | Source: Home / Self Care | Attending: Plastic Surgery

## 2018-10-02 ENCOUNTER — Ambulatory Visit (HOSPITAL_BASED_OUTPATIENT_CLINIC_OR_DEPARTMENT_OTHER): Payer: BLUE CROSS/BLUE SHIELD | Admitting: Anesthesiology

## 2018-10-02 ENCOUNTER — Other Ambulatory Visit: Payer: Self-pay

## 2018-10-02 ENCOUNTER — Encounter (HOSPITAL_BASED_OUTPATIENT_CLINIC_OR_DEPARTMENT_OTHER): Payer: Self-pay | Admitting: *Deleted

## 2018-10-02 DIAGNOSIS — K219 Gastro-esophageal reflux disease without esophagitis: Secondary | ICD-10-CM | POA: Insufficient documentation

## 2018-10-02 DIAGNOSIS — Z791 Long term (current) use of non-steroidal anti-inflammatories (NSAID): Secondary | ICD-10-CM | POA: Diagnosis not present

## 2018-10-02 DIAGNOSIS — M549 Dorsalgia, unspecified: Secondary | ICD-10-CM | POA: Diagnosis not present

## 2018-10-02 DIAGNOSIS — N6489 Other specified disorders of breast: Secondary | ICD-10-CM | POA: Diagnosis present

## 2018-10-02 DIAGNOSIS — I10 Essential (primary) hypertension: Secondary | ICD-10-CM | POA: Insufficient documentation

## 2018-10-02 DIAGNOSIS — Z87442 Personal history of urinary calculi: Secondary | ICD-10-CM | POA: Insufficient documentation

## 2018-10-02 DIAGNOSIS — E669 Obesity, unspecified: Secondary | ICD-10-CM | POA: Diagnosis not present

## 2018-10-02 DIAGNOSIS — Z79899 Other long term (current) drug therapy: Secondary | ICD-10-CM | POA: Insufficient documentation

## 2018-10-02 DIAGNOSIS — D649 Anemia, unspecified: Secondary | ICD-10-CM | POA: Diagnosis not present

## 2018-10-02 DIAGNOSIS — N62 Hypertrophy of breast: Secondary | ICD-10-CM | POA: Diagnosis present

## 2018-10-02 DIAGNOSIS — M542 Cervicalgia: Secondary | ICD-10-CM | POA: Diagnosis not present

## 2018-10-02 DIAGNOSIS — Z6833 Body mass index (BMI) 33.0-33.9, adult: Secondary | ICD-10-CM | POA: Diagnosis not present

## 2018-10-02 DIAGNOSIS — G8929 Other chronic pain: Secondary | ICD-10-CM | POA: Diagnosis not present

## 2018-10-02 DIAGNOSIS — Z87891 Personal history of nicotine dependence: Secondary | ICD-10-CM | POA: Diagnosis not present

## 2018-10-02 HISTORY — DX: Eosinophilic esophagitis: K20.0

## 2018-10-02 HISTORY — DX: Anemia, unspecified: D64.9

## 2018-10-02 HISTORY — PX: BREAST REDUCTION SURGERY: SHX8

## 2018-10-02 HISTORY — DX: Essential (primary) hypertension: I10

## 2018-10-02 HISTORY — DX: Gastro-esophageal reflux disease without esophagitis: K21.9

## 2018-10-02 SURGERY — MAMMOPLASTY, REDUCTION
Anesthesia: General | Site: Breast | Laterality: Bilateral

## 2018-10-02 MED ORDER — SUGAMMADEX SODIUM 200 MG/2ML IV SOLN
INTRAVENOUS | Status: DC | PRN
  Administered 2018-10-02: 250 mg via INTRAVENOUS

## 2018-10-02 MED ORDER — DEXAMETHASONE SODIUM PHOSPHATE 10 MG/ML IJ SOLN
INTRAMUSCULAR | Status: AC
  Filled 2018-10-02: qty 1

## 2018-10-02 MED ORDER — SUCCINYLCHOLINE CHLORIDE 200 MG/10ML IV SOSY
PREFILLED_SYRINGE | INTRAVENOUS | Status: AC
  Filled 2018-10-02: qty 10

## 2018-10-02 MED ORDER — SODIUM CHLORIDE (PF) 0.9 % IJ SOLN
INTRAMUSCULAR | Status: AC
  Filled 2018-10-02: qty 10

## 2018-10-02 MED ORDER — SUGAMMADEX SODIUM 500 MG/5ML IV SOLN
INTRAVENOUS | Status: AC
  Filled 2018-10-02: qty 5

## 2018-10-02 MED ORDER — BUPIVACAINE-EPINEPHRINE (PF) 0.25% -1:200000 IJ SOLN
INTRAMUSCULAR | Status: AC
  Filled 2018-10-02: qty 60

## 2018-10-02 MED ORDER — SODIUM CHLORIDE 0.9 % IV SOLN
INTRAVENOUS | Status: DC | PRN
  Administered 2018-10-02: 500 mL

## 2018-10-02 MED ORDER — SODIUM CHLORIDE 0.9% FLUSH: 3.0000 mL | Freq: Two times a day (BID) | INTRAVENOUS | Status: DC

## 2018-10-02 MED ORDER — SCOPOLAMINE 1 MG/3DAYS TD PT72
MEDICATED_PATCH | TRANSDERMAL | Status: AC
  Filled 2018-10-02: qty 1

## 2018-10-02 MED ORDER — SUFENTANIL CITRATE 50 MCG/ML IV SOLN
INTRAVENOUS | Status: AC
  Filled 2018-10-02: qty 1

## 2018-10-02 MED ORDER — MEPERIDINE HCL 25 MG/ML IJ SOLN: 6.2500 mg | INTRAMUSCULAR | Status: DC | PRN

## 2018-10-02 MED ORDER — SUFENTANIL CITRATE 50 MCG/ML IV SOLN
INTRAVENOUS | Status: DC | PRN
  Administered 2018-10-02: 5 ug via INTRAVENOUS
  Administered 2018-10-02 (×2): 10 ug via INTRAVENOUS

## 2018-10-02 MED ORDER — DEXAMETHASONE SODIUM PHOSPHATE 4 MG/ML IJ SOLN
INTRAMUSCULAR | Status: DC | PRN
  Administered 2018-10-02: 10 mg via INTRAVENOUS

## 2018-10-02 MED ORDER — SCOPOLAMINE 1 MG/3DAYS TD PT72
1.0000 | MEDICATED_PATCH | Freq: Once | TRANSDERMAL | Status: DC | PRN
  Administered 2018-10-02: 1.5 mg via TRANSDERMAL

## 2018-10-02 MED ORDER — SCOPOLAMINE 1 MG/3DAYS TD PT72: 1.0000 | MEDICATED_PATCH | TRANSDERMAL | Status: DC

## 2018-10-02 MED ORDER — BUPIVACAINE-EPINEPHRINE 0.25% -1:200000 IJ SOLN
INTRAMUSCULAR | Status: DC | PRN
  Administered 2018-10-02: 30 mL

## 2018-10-02 MED ORDER — EPHEDRINE 5 MG/ML INJ
INTRAVENOUS | Status: AC
  Filled 2018-10-02: qty 10

## 2018-10-02 MED ORDER — HYDROMORPHONE HCL 1 MG/ML IJ SOLN
INTRAMUSCULAR | Status: AC
  Filled 2018-10-02: qty 0.5

## 2018-10-02 MED ORDER — METOCLOPRAMIDE HCL 5 MG/ML IJ SOLN: 10.0000 mg | Freq: Once | INTRAMUSCULAR | Status: DC | PRN

## 2018-10-02 MED ORDER — CHLORHEXIDINE GLUCONATE 4 % EX LIQD: 1.0000 "application " | Freq: Once | CUTANEOUS | Status: DC

## 2018-10-02 MED ORDER — PHENYLEPHRINE HCL (PRESSORS) 10 MG/ML IV SOLN
INTRAVENOUS | Status: DC | PRN
  Administered 2018-10-02 (×3): 80 ug via INTRAVENOUS

## 2018-10-02 MED ORDER — ROCURONIUM BROMIDE 10 MG/ML (PF) SYRINGE
PREFILLED_SYRINGE | INTRAVENOUS | Status: AC
  Filled 2018-10-02: qty 10

## 2018-10-02 MED ORDER — HYDROMORPHONE HCL 1 MG/ML IJ SOLN
0.2500 mg | INTRAMUSCULAR | Status: DC | PRN
  Administered 2018-10-02 (×2): 0.5 mg via INTRAVENOUS
  Administered 2018-10-02 (×2): 0.25 mg via INTRAVENOUS

## 2018-10-02 MED ORDER — ACETAMINOPHEN 500 MG PO TABS
1000.0000 mg | ORAL_TABLET | Freq: Once | ORAL | Status: AC
  Administered 2018-10-02: 1000 mg via ORAL

## 2018-10-02 MED ORDER — LIDOCAINE 2% (20 MG/ML) 5 ML SYRINGE
INTRAMUSCULAR | Status: AC
  Filled 2018-10-02: qty 5

## 2018-10-02 MED ORDER — EPINEPHRINE PF 1 MG/ML IJ SOLN
INTRAMUSCULAR | Status: AC
  Filled 2018-10-02: qty 1

## 2018-10-02 MED ORDER — SODIUM CHLORIDE 0.9 % IV SOLN
INTRAVENOUS | Status: AC
  Filled 2018-10-02: qty 500000

## 2018-10-02 MED ORDER — ACETAMINOPHEN 500 MG PO TABS
ORAL_TABLET | ORAL | Status: AC
  Filled 2018-10-02: qty 2

## 2018-10-02 MED ORDER — PHENYLEPHRINE 40 MCG/ML (10ML) SYRINGE FOR IV PUSH (FOR BLOOD PRESSURE SUPPORT)
PREFILLED_SYRINGE | INTRAVENOUS | Status: AC
  Filled 2018-10-02: qty 10

## 2018-10-02 MED ORDER — MIDAZOLAM HCL 2 MG/2ML IJ SOLN
INTRAMUSCULAR | Status: AC
  Filled 2018-10-02: qty 2

## 2018-10-02 MED ORDER — DIPHENHYDRAMINE HCL 50 MG/ML IJ SOLN
INTRAMUSCULAR | Status: DC | PRN
  Administered 2018-10-02: 12.5 mg via INTRAVENOUS

## 2018-10-02 MED ORDER — ONDANSETRON HCL 4 MG/2ML IJ SOLN
INTRAMUSCULAR | Status: AC
  Filled 2018-10-02: qty 2

## 2018-10-02 MED ORDER — LIDOCAINE HCL (PF) 1 % IJ SOLN
INTRAMUSCULAR | Status: AC
  Filled 2018-10-02: qty 60

## 2018-10-02 MED ORDER — ONDANSETRON HCL 4 MG/2ML IJ SOLN
INTRAMUSCULAR | Status: DC | PRN
  Administered 2018-10-02: 4 mg via INTRAVENOUS

## 2018-10-02 MED ORDER — LACTATED RINGERS IV SOLN
INTRAVENOUS | Status: DC
  Administered 2018-10-02 (×3): via INTRAVENOUS

## 2018-10-02 MED ORDER — ROCURONIUM BROMIDE 100 MG/10ML IV SOLN
INTRAVENOUS | Status: DC | PRN
  Administered 2018-10-02: 70 mg via INTRAVENOUS
  Administered 2018-10-02: 20 mg via INTRAVENOUS

## 2018-10-02 MED ORDER — CEFAZOLIN SODIUM-DEXTROSE 2-4 GM/100ML-% IV SOLN
INTRAVENOUS | Status: AC
  Filled 2018-10-02: qty 100

## 2018-10-02 MED ORDER — CEFAZOLIN SODIUM-DEXTROSE 2-4 GM/100ML-% IV SOLN
2.0000 g | INTRAVENOUS | Status: AC
  Administered 2018-10-02: 2 g via INTRAVENOUS

## 2018-10-02 MED ORDER — SODIUM CHLORIDE 0.9% FLUSH: 3.0000 mL | INTRAVENOUS | Status: DC | PRN

## 2018-10-02 MED ORDER — PROPOFOL 10 MG/ML IV BOLUS
INTRAVENOUS | Status: DC | PRN
  Administered 2018-10-02: 150 mg via INTRAVENOUS

## 2018-10-02 MED ORDER — FENTANYL CITRATE (PF) 100 MCG/2ML IJ SOLN: 50.0000 ug | INTRAMUSCULAR | Status: DC | PRN

## 2018-10-02 MED ORDER — SODIUM CHLORIDE 0.9 % IV SOLN: 250.0000 mL | INTRAVENOUS | Status: DC | PRN

## 2018-10-02 MED ORDER — MIDAZOLAM HCL 2 MG/2ML IJ SOLN
1.0000 mg | INTRAMUSCULAR | Status: DC | PRN
  Administered 2018-10-02: 2 mg via INTRAVENOUS

## 2018-10-02 MED ORDER — LIDOCAINE HCL (CARDIAC) PF 100 MG/5ML IV SOSY
PREFILLED_SYRINGE | INTRAVENOUS | Status: DC | PRN
  Administered 2018-10-02: 75 mg via INTRAVENOUS

## 2018-10-02 SURGICAL SUPPLY — 46 items
ADH SKN CLS APL DERMABOND .7 (GAUZE/BANDAGES/DRESSINGS) ×3
APL PRP STRL LF DISP 70% ISPRP (MISCELLANEOUS) ×2
BAG DECANTER FOR FLEXI CONT (MISCELLANEOUS) ×2 IMPLANT
BINDER BREAST XLRG (GAUZE/BANDAGES/DRESSINGS) IMPLANT
BINDER BREAST XXLRG (GAUZE/BANDAGES/DRESSINGS) ×1 IMPLANT
BLADE HEX COATED 2.75 (ELECTRODE) ×2 IMPLANT
BLADE KNIFE PERSONA 10 (BLADE) ×4 IMPLANT
BLADE SURG 15 STRL LF DISP TIS (BLADE) IMPLANT
BLADE SURG 15 STRL SS (BLADE) ×2
CANISTER SUCT 1200ML W/VALVE (MISCELLANEOUS) ×2 IMPLANT
CHLORAPREP W/TINT 26 (MISCELLANEOUS) ×3 IMPLANT
COVER BACK TABLE REUSABLE LG (DRAPES) ×2 IMPLANT
COVER MAYO STAND REUSABLE (DRAPES) ×2 IMPLANT
DERMABOND ADVANCED (GAUZE/BANDAGES/DRESSINGS) ×3
DERMABOND ADVANCED .7 DNX12 (GAUZE/BANDAGES/DRESSINGS) IMPLANT
DRAPE LAPAROSCOPIC ABDOMINAL (DRAPES) ×2 IMPLANT
DRSG PAD ABDOMINAL 8X10 ST (GAUZE/BANDAGES/DRESSINGS) ×4 IMPLANT
ELECT BLADE 4.0 EZ CLEAN MEGAD (MISCELLANEOUS) ×2
ELECT REM PT RETURN 9FT ADLT (ELECTROSURGICAL) ×2
ELECTRODE BLDE 4.0 EZ CLN MEGD (MISCELLANEOUS) IMPLANT
ELECTRODE REM PT RTRN 9FT ADLT (ELECTROSURGICAL) ×1 IMPLANT
GAUZE SPONGE 4X4 12PLY STRL LF (GAUZE/BANDAGES/DRESSINGS) ×2 IMPLANT
GLOVE BIO SURGEON STRL SZ 6.5 (GLOVE) ×6 IMPLANT
GLOVE BIOGEL PI IND STRL 6.5 (GLOVE) IMPLANT
GLOVE BIOGEL PI INDICATOR 6.5 (GLOVE) ×1
GLOVE ECLIPSE 6.5 STRL STRAW (GLOVE) ×1 IMPLANT
GOWN STRL REUS W/ TWL LRG LVL3 (GOWN DISPOSABLE) ×3 IMPLANT
GOWN STRL REUS W/TWL LRG LVL3 (GOWN DISPOSABLE) ×6
NDL HYPO 25X1 1.5 SAFETY (NEEDLE) ×1 IMPLANT
NEEDLE HYPO 25X1 1.5 SAFETY (NEEDLE) ×2 IMPLANT
PACK BASIN DAY SURGERY FS (CUSTOM PROCEDURE TRAY) ×2 IMPLANT
PENCIL BUTTON HOLSTER BLD 10FT (ELECTRODE) ×2 IMPLANT
SLEEVE SCD COMPRESS KNEE MED (MISCELLANEOUS) ×2 IMPLANT
SPONGE LAP 18X18 RF (DISPOSABLE) ×4 IMPLANT
STRIP SUTURE WOUND CLOSURE 1/2 (SUTURE) ×6 IMPLANT
SUT MNCRL AB 4-0 PS2 18 (SUTURE) ×8 IMPLANT
SUT MON AB 3-0 SH 27 (SUTURE) ×6
SUT MON AB 3-0 SH27 (SUTURE) ×1 IMPLANT
SUT MON AB 5-0 PS2 18 (SUTURE) ×7 IMPLANT
SYR BULB IRRIGATION 50ML (SYRINGE) ×2 IMPLANT
SYR CONTROL 10ML LL (SYRINGE) ×2 IMPLANT
TAPE MEASURE VINYL STERILE (MISCELLANEOUS) ×2 IMPLANT
TOWEL GREEN STERILE FF (TOWEL DISPOSABLE) ×4 IMPLANT
TUBE CONNECTING 20X1/4 (TUBING) ×3 IMPLANT
UNDERPAD 30X30 (UNDERPADS AND DIAPERS) ×4 IMPLANT
YANKAUER SUCT BULB TIP NO VENT (SUCTIONS) ×2 IMPLANT

## 2018-10-02 NOTE — Anesthesia Postprocedure Evaluation (Signed)
Anesthesia Post Note  Patient: Bethany West  Procedure(s) Performed: MAMMARY REDUCTION  (BREAST) (Bilateral Breast)     Patient location during evaluation: PACU Anesthesia Type: General Level of consciousness: awake and alert and oriented Pain management: pain level controlled Vital Signs Assessment: post-procedure vital signs reviewed and stable Respiratory status: spontaneous breathing, nonlabored ventilation and respiratory function stable Cardiovascular status: blood pressure returned to baseline and stable Postop Assessment: no apparent nausea or vomiting Anesthetic complications: no    Last Vitals:  Vitals:   10/02/18 1215 10/02/18 1250  BP: 119/60 109/61  Pulse: 82 92  Resp: 16 16  Temp:  36.6 C  SpO2: 94% 96%    Last Pain:  Vitals:   10/02/18 1250  TempSrc: Oral  PainSc: 2                  Dylin Ihnen A.

## 2018-10-02 NOTE — Interval H&P Note (Signed)
History and Physical Interval Note:  10/02/2018 6:55 AM  Bethany West  has presented today for surgery, with the diagnosis of mammary hypertrophy.  The various methods of treatment have been discussed with the patient and family. After consideration of risks, benefits and other options for treatment, the patient has consented to  Procedure(s): MAMMARY REDUCTION  (BREAST) (Bilateral) as a surgical intervention.  The patient's history has been reviewed, patient examined, no change in status, stable for surgery.  I have reviewed the patient's chart and labs.  Questions were answered to the patient's satisfaction.     Alena Bills Maisie Hauser

## 2018-10-02 NOTE — Transfer of Care (Signed)
Immediate Anesthesia Transfer of Care Note  Patient: Bethany West  Procedure(s) Performed: MAMMARY REDUCTION  (BREAST) (Bilateral Breast)  Patient Location: PACU  Anesthesia Type:General  Level of Consciousness: awake, alert , oriented and drowsy  Airway & Oxygen Therapy: Patient Spontanous Breathing and Patient connected to face mask oxygen  Post-op Assessment: Report given to RN and Post -op Vital signs reviewed and stable  Post vital signs: Reviewed and stable  Last Vitals:  Vitals Value Taken Time  BP 115/63 10/02/2018 11:00 AM  Temp    Pulse 90 10/02/2018 11:04 AM  Resp 16 10/02/2018 11:04 AM  SpO2 100 % 10/02/2018 11:04 AM  Vitals shown include unvalidated device data.  Last Pain:  Vitals:   10/02/18 0649  TempSrc: Oral  PainSc: 0-No pain         Complications: No apparent anesthesia complications

## 2018-10-02 NOTE — Discharge Instructions (Signed)
NO tylenol before 6 pm today !        INSTRUCTIONS FOR AFTER SURGERY   You are having surgery.  You will likely have some questions about what to expect following your operation.  The following information will help you and your family understand what to expect when you are discharged from the hospital.  Following these guidelines will help ensure a smooth recovery and reduce risks of complications.   Postoperative instructions include information on: diet, wound care, medications and physical activity.  AFTER SURGERY Expect to go home after the procedure.  In some cases, you may need to spend one night in the hospital for observation.  DIET This surgery does not require a specific diet.  However, I have to mention that the healthier you eat the better your body can start healing. It is important to increasing your protein intake.  This means limiting the foods with sugar and carbohydrates.  Focus on vegetables and some meat.  If you have any liposuction during your procedure be sure to drink water.  If your urine is bright yellow, then it is concentrated, and you need to drink more water.  As a general rule after surgery, you should have 8 ounces of water every hour while awake.  If you find you are persistently nauseated or unable to take in liquids let us know.  NO TOBACCO USE or EXPOSURE.  This will slow your healing process and increase the risk of a wound.  WOUND CARE You can shower the day after surgery if you don't have a drain.  Use fragrance free soap.  Dial, Dove and [place] are usually mild on the skin. If you have a drain clean with baby wipes until the drain is removed.  If you have steri-strips / tape directly attached to your skin leave them in place. It is OK to get these wet.  No baths, pools or hot tubs for two weeks. We close your incision to leave the smallest and best-looking scar. No ointment or creams on your incisions until given the go ahead.  Especially not Neosporin  (Too many skin reactions with this one).  A few weeks after surgery you can use Mederma and start massaging the scar. We ask you to wear your binder or sports bra for the first 6 weeks around the clock, including while sleeping. This provides added comfort and helps reduce the fluid accumulation at the surgery site.  ACTIVITY No heavy lifting until cleared by the doctor.  It is OK to walk and climb stairs. In fact, moving your legs is very important to decrease your risk of a blood clot.  It will also help keep you from getting deconditioned.  Every 1 to 2 hours get up and walk for 5 minutes. This will help with a quicker recovery back to normal.  Let pain be your guide so you don't do too much.  NO, you cannot do the spring cleaning and don't plan on taking care of anyone else.  This is your time for TLC.  You will be more comfortable if you sleep and rest with your head elevated either with a few pillows under you or in a recliner.  No stomach sleeping for a few months.  WORK Everyone returns to work at different times. As a rough guide, most people take at least 1 - 2 weeks off prior to returning to work. If you need documentation for your job, bring the forms to your postoperative follow up visit.  DRIVING Arrange for someone to bring you home from the hospital.  You may be able to drive a few days after surgery but not while taking any narcotics or valium.  BOWEL MOVEMENTS Constipation can occur after anesthesia and while taking pain medication.  It is important to stay ahead for your comfort.  We recommend taking Milk of Magnesia (2 tablespoons; twice a day) while taking the pain pills.  SEROMA This is fluid your body tried to put in the surgical site.  This is normal but if it creates tight skinny skin let us know.  It usually decreases in a few weeks.  WHEN TO CALL Call your surgeon's office if any of the following occur:  Fever 101 degrees F or greater  Excessive bleeding or fluid  from the incision site.  Pain that increases over time without aid from the medications  Redness, warmth, or pus draining from incision sites  Persistent nausea or inability to take in liquids  Severe misshapen area that underwent the operation.       Post Anesthesia Home Care Instructions  Activity: Get plenty of rest for the remainder of the day. A responsible individual must stay with you for 24 hours following the procedure.  For the next 24 hours, DO NOT: -Drive a car -Advertising copywriterperate machinery -Drink alcoholic beverages -Take any medication unless instructed by your physician -Make any legal decisions or sign important papers.  Meals: Start with liquid foods such as gelatin or soup. Progress to regular foods as tolerated. Avoid greasy, spicy, heavy foods. If nausea and/or vomiting occur, drink only clear liquids until the nausea and/or vomiting subsides. Call your physician if vomiting continues.  Special Instructions/Symptoms: Your throat may feel dry or sore from the anesthesia or the breathing tube placed in your throat during surgery. If this causes discomfort, gargle with warm salt water. The discomfort should disappear within 24 hours.  If you had a scopolamine patch placed behind your ear for the management of post- operative nausea and/or vomiting:  1. The medication in the patch is effective for 72 hours, after which it should be removed.  Wrap patch in a tissue and discard in the trash. Wash hands thoroughly with soap and water. 2. You may remove the patch earlier than 72 hours if you experience unpleasant side effects which may include dry mouth, dizziness or visual disturbances. 3. Avoid touching the patch. Wash your hands with soap and water after contact with the patch.

## 2018-10-02 NOTE — Op Note (Signed)
Breast Reduction Op note:    DATE OF PROCEDURE: 10/02/2018  LOCATION: [facility]  SURGEON: Alan Ripper Sanger Seneca Hoback, DO  ASSISTANT: Enedina Finner, RNFA  PREOPERATIVE DIAGNOSIS 1. Macromastia 2. Neck Pain 3. Back Pain  POSTOPERATIVE DIAGNOSIS 1. Macromastia 2. Neck Pain 3. Back Pain  PROCEDURES 1. Bilateral breast reduction.  Right reduction 891g, Left reduction 924g  COMPLICATIONS: None.  DRAINS: none  INDICATIONS FOR PROCEDURE Bethany West is a 53 y.o. year-old female born on Jan 22, 1966,with a history of symptomatic macromastia with concominant back pain, neck pain, shoulder grooving from her bra.   MRN: [id number]  CONSENT Informed consent was obtained directly from the patient. The risks, benefits and alternatives were fully discussed. Specific risks including but not limited to bleeding, infection, hematoma, seroma, scarring, pain, nipple necrosis, asymmetry, poor cosmetic results, and need for further surgery were discussed. The patient had ample opportunity to have her questions answered to her satisfaction.  DESCRIPTION OF PROCEDURE  Patient was brought into the operating room and placed in a supine position.  SCDs were placed and appropriate padding was performed.  Antibiotics were given. The patient underwent general anesthesia and the chest was prepped and draped in a sterile fashion.  A timeout was performed and all information was confirmed to be correct.  Right side: Preoperative markings were confirmed.  Incision lines were injected with 1% Xylocaine with epinephrine.  After waiting for vasoconstriction, the marked lines were incised.  A Wise-pattern superomedial breast reduction was performed by de-epithelializing the pedicle, using bovie to create the superomedial pedicle, and removing breast tissue from the superior, lateral, and inferior portions of the breast.  Care was taken to not undermine the breast pedicle. Hemostasis was achieved.   The nipple was gently rotated into position and the soft tissue closed with 4-0 Monocryl.   The pocket was irrigated and hemostasis confirmed.  The deep tissues were approximated with 3-0 monocryl sutures and the skin was closed with deep dermal and subcuticular 4-0 Monocryl sutures.  The nipple and skin flaps had good capillary refill at the end of the procedure.    Left side: Preoperative markings were confirmed.  Incision lines were injected with 1% Xylocaine with epinephrine.  After waiting for vasoconstriction, the marked lines were incised.  A Wise-pattern superomedial breast reduction was performed by de-epithelializing the pedicle, using bovie to create the superomedial pedicle, and removing breast tissue from the superior, lateral, and inferior portions of the breast.  Care was taken to not undermine the breast pedicle. Hemostasis was achieved.  The nipple was gently rotated into position and the soft tissue was closed with 4-0 Monocryl.  The patient was sat upright and size and shape symmetry was confirmed.  The pocket was irrigated and hemostasis confirmed.  The deep tissues were approximated with 3-0 Monocryl sutures and the skin was closed with deep dermal and subcuticular 4-0 Monocryl sutures.  Dermabond was applied.  A breast binder and ABDs were placed.  The nipple and skin flaps had good capillary refill at the end of the procedure.  The patient tolerated the procedure well. The patient was allowed to wake from anesthesia and taken to the recovery room in satisfactory condition

## 2018-10-02 NOTE — Anesthesia Procedure Notes (Signed)

## 2018-10-03 ENCOUNTER — Encounter (HOSPITAL_BASED_OUTPATIENT_CLINIC_OR_DEPARTMENT_OTHER): Payer: Self-pay | Admitting: Plastic Surgery

## 2018-10-07 ENCOUNTER — Encounter: Payer: Self-pay | Admitting: Plastic Surgery

## 2018-10-07 ENCOUNTER — Other Ambulatory Visit: Payer: Self-pay

## 2018-10-07 ENCOUNTER — Ambulatory Visit (INDEPENDENT_AMBULATORY_CARE_PROVIDER_SITE_OTHER): Payer: BLUE CROSS/BLUE SHIELD | Admitting: Plastic Surgery

## 2018-10-07 VITALS — BP 122/87 | HR 92 | Temp 98.9°F | Ht 69.0 in | Wt 223.0 lb

## 2018-10-07 DIAGNOSIS — N62 Hypertrophy of breast: Secondary | ICD-10-CM

## 2018-10-07 NOTE — Progress Notes (Signed)
   Subjective:    Patient ID: Bethany West, female    DOB: May 02, 1966, 53 y.o.   MRN: [id number]  The patient is a 53 year old female who underwent a bilateral breast reduction.  She is pleased but noticed some tightness and feels like there is some fluid buildup.  The incisions are healing well.  There is minimal bruising but the skin is a little bit tight.  No sign of infection.   Review of Systems  Constitutional: Positive for activity change. Negative for appetite change.  Eyes: Negative.   Respiratory: Negative.  Negative for shortness of breath.   Cardiovascular: Negative.   Genitourinary: Negative.   Musculoskeletal: Negative.   Psychiatric/Behavioral: Negative.        Objective:   Physical Exam Vitals signs and nursing note reviewed.  Constitutional:      Appearance: Normal appearance.  HENT:     Head: Normocephalic and atraumatic.  Cardiovascular:     Rate and Rhythm: Normal rate.  Pulmonary:     Effort: Pulmonary effort is normal.  Skin:    Capillary Refill: Capillary refill takes less than 2 seconds.  Neurological:     General: No focal deficit present.     Mental Status: She is alert.  Psychiatric:        Mood and Affect: Mood normal.        Behavior: Behavior normal.        Assessment & Plan:  Symptomatic mammary hypertrophy Both breasts were drained with 300 cc of serosanguineous fluid removed.  She felt much better after the removal.  I stressed to her the importance of drinking a lot of fluid today due to the fluid being removed.  She says she has been very good about drinking a lot of water.  I will see her back in a week.

## 2018-10-08 ENCOUNTER — Encounter: Payer: Self-pay | Admitting: Plastic Surgery

## 2018-10-08 ENCOUNTER — Ambulatory Visit (INDEPENDENT_AMBULATORY_CARE_PROVIDER_SITE_OTHER): Payer: BLUE CROSS/BLUE SHIELD | Admitting: Plastic Surgery

## 2018-10-08 VITALS — BP 128/84 | HR 99 | Temp 98.9°F | Ht 69.0 in | Wt 223.0 lb

## 2018-10-08 DIAGNOSIS — N62 Hypertrophy of breast: Secondary | ICD-10-CM

## 2018-10-08 NOTE — Progress Notes (Signed)
The patient is a 53 year old female here for follow-up on her breast reduction.  The patient states she noticed some fullness in the left breast again last night.  It is better than it was yesterday prior to the drainage.  The incisions are still doing well.  There is no sign of infection.  We were able to removed 100 cc of serosanguineous fluid from the left breast.  Follow-up in a week.

## 2018-10-10 ENCOUNTER — Encounter: Payer: BLUE CROSS/BLUE SHIELD | Admitting: Plastic Surgery

## 2018-10-13 ENCOUNTER — Telehealth: Payer: Self-pay | Admitting: Plastic Surgery

## 2018-10-13 NOTE — Telephone Encounter (Signed)

## 2018-10-14 ENCOUNTER — Encounter: Payer: Self-pay | Admitting: Plastic Surgery

## 2018-10-14 ENCOUNTER — Other Ambulatory Visit: Payer: Self-pay

## 2018-10-14 ENCOUNTER — Ambulatory Visit (INDEPENDENT_AMBULATORY_CARE_PROVIDER_SITE_OTHER): Payer: BLUE CROSS/BLUE SHIELD | Admitting: Plastic Surgery

## 2018-10-14 VITALS — BP 122/70 | Temp 98.7°F | Ht 69.0 in | Wt 228.0 lb

## 2018-10-14 DIAGNOSIS — N62 Hypertrophy of breast: Secondary | ICD-10-CM

## 2018-10-14 NOTE — Progress Notes (Addendum)
   Subjective:    Patient ID: Bethany West, female    DOB: 1966-02-05, 53 y.o.   MRN: [id number]  The patient is a 53 yrs old wf here for follow up on her bilateral breast reduction.  She notice tightness in the breast again.  No fevers. No sign of infection.  Mild redness and tightness of the skin at the incisions.       Review of Systems  Constitutional: Negative.   HENT: Negative.   Eyes: Negative.   Respiratory: Negative.   Cardiovascular: Negative.   Genitourinary: Negative.   Skin: Positive for color change.  Hematological: Negative.   Psychiatric/Behavioral: Negative.        Objective:   Physical Exam Vitals signs and nursing note reviewed.  Constitutional:      Appearance: Normal appearance.  HENT:     Head: Normocephalic and atraumatic.  Cardiovascular:     Rate and Rhythm: Normal rate.  Neurological:     General: No focal deficit present.     Mental Status: She is alert.  Psychiatric:        Mood and Affect: Mood normal.       Assessment & Plan:  Symptomatic mammary hypertrophy  We were able to aspirate 150 cc serosanginous drainage from the right breast and 200 cc from the left.  She felt much better. Follow up in 1 week.

## 2018-10-15 ENCOUNTER — Telehealth: Payer: Self-pay | Admitting: Plastic Surgery

## 2018-10-15 NOTE — Telephone Encounter (Signed)
Received call from patient wanting to clarify with Dr. Ulice Bold if it was ok for her to receive a hyaluronic acid injection to her right knee on Monday as she is currently only two weeks post op.

## 2018-10-16 NOTE — Telephone Encounter (Signed)
Called the patient back on (10/15/18) and informed her that I spoke with Dr. Ulice Bold regarding the message below, and she said it is okay for her to receive the hyaluronic acid injection for her knee.  Patient verbalized understanding and agreed.//AB/CMA

## 2018-10-20 ENCOUNTER — Encounter: Payer: Self-pay | Admitting: Plastic Surgery

## 2018-10-20 ENCOUNTER — Other Ambulatory Visit: Payer: Self-pay

## 2018-10-20 ENCOUNTER — Ambulatory Visit (INDEPENDENT_AMBULATORY_CARE_PROVIDER_SITE_OTHER): Payer: BC Managed Care – PPO | Admitting: Plastic Surgery

## 2018-10-20 VITALS — BP 137/85 | HR 81 | Temp 98.0°F | Ht 69.0 in | Wt 226.4 lb

## 2018-10-20 DIAGNOSIS — N62 Hypertrophy of breast: Secondary | ICD-10-CM

## 2018-10-20 NOTE — Progress Notes (Signed)
The patient is a 53 year old female here for follow-up after her breast reduction.  She noticed fluid filling in the left breast again.  I was able to aspirate 150 cc of serosanguineous drainage.  Nothing appears to be infected.  There is no sign of drainage or swelling.  The incisions are healing nicely.  I would like to see her back in 1 week

## 2018-10-21 ENCOUNTER — Ambulatory Visit: Payer: BLUE CROSS/BLUE SHIELD | Admitting: Plastic Surgery

## 2018-10-21 ENCOUNTER — Encounter: Payer: Self-pay | Admitting: Plastic Surgery

## 2018-10-30 ENCOUNTER — Telehealth: Payer: Self-pay | Admitting: Plastic Surgery

## 2018-10-30 NOTE — Telephone Encounter (Signed)

## 2018-10-31 ENCOUNTER — Ambulatory Visit (INDEPENDENT_AMBULATORY_CARE_PROVIDER_SITE_OTHER): Payer: BC Managed Care – PPO | Admitting: Plastic Surgery

## 2018-10-31 ENCOUNTER — Encounter: Payer: Self-pay | Admitting: Plastic Surgery

## 2018-10-31 ENCOUNTER — Other Ambulatory Visit: Payer: Self-pay

## 2018-10-31 VITALS — BP 115/77 | HR 89 | Temp 98.4°F | Ht 69.0 in | Wt 220.6 lb

## 2018-10-31 DIAGNOSIS — M542 Cervicalgia: Secondary | ICD-10-CM

## 2018-10-31 DIAGNOSIS — M546 Pain in thoracic spine: Secondary | ICD-10-CM

## 2018-10-31 DIAGNOSIS — N62 Hypertrophy of breast: Secondary | ICD-10-CM

## 2018-10-31 DIAGNOSIS — G8929 Other chronic pain: Secondary | ICD-10-CM

## 2018-11-01 ENCOUNTER — Encounter: Payer: Self-pay | Admitting: Plastic Surgery

## 2018-11-01 NOTE — Progress Notes (Signed)
Patient is a 53 year old follow-up after undergoing breast she noticed some tightening of the right breast.  There is no redness and no sign of infection.  There is a slight opening of the vertical incision at the juncture of the nipple area Ola.  This is likely related to the pressure.  We were able to aspirate 250 cc of fluid.  She immediately felt much better.  The fluid was serosanguineous.  It did not appear to be infected at all.  I will see her back as needed.  She expressed very pleased with

## 2018-11-03 ENCOUNTER — Telehealth: Payer: Self-pay | Admitting: Plastic Surgery

## 2018-11-03 NOTE — Telephone Encounter (Signed)
Patient called to schedule appointment for tomorrow. Patient answered the following questions: 1. To the best of your knowledge, have you been in close contact with any one with a confirmed diagnosis of COVID-19? No 2. Have you had any one or more of the following; fever, chills, cough, shortness of breath, or any flu-like symptoms? No 3. Have you been diagnosed with or have a previous diagnosis of COVID 19? No 4. I am going to go over a few other symptoms with you. Please let me know if you are experiencing any of the following: None of the below a. Ear, nose, or throat discomfort b. A sore throat c. Headache d. Muscle pain e. Diarrhea f. Loss of taste or smell   

## 2018-11-04 ENCOUNTER — Ambulatory Visit (INDEPENDENT_AMBULATORY_CARE_PROVIDER_SITE_OTHER): Payer: BC Managed Care – PPO | Admitting: Plastic Surgery

## 2018-11-04 ENCOUNTER — Encounter: Payer: Self-pay | Admitting: Plastic Surgery

## 2018-11-04 ENCOUNTER — Other Ambulatory Visit: Payer: Self-pay

## 2018-11-04 VITALS — BP 127/75 | HR 85 | Temp 97.9°F | Ht 69.0 in | Wt 218.0 lb

## 2018-11-04 DIAGNOSIS — N62 Hypertrophy of breast: Secondary | ICD-10-CM

## 2018-11-04 NOTE — Progress Notes (Addendum)
The patient is a 53 year old female here for follow-up on her bilateral breast reduction.  She noticed some filling of fluid on the right breast.  We removed 200 cc of serous fluid.  Overall it does appear to be improving.  She is doing a lot more activity at work.  This is likely contributing to excess seroma formation.  I suggest she take 2 weeks off to let this heal and then scar down.  She is in agreement to that.  11/09/18 Patient called with concerns of redness and temp of 99.2.  Requesting antibiotics.  Antibiotics sent into pharmacy.  Plan to see patient this week.  Continue with Tylenol and Motrin.

## 2018-11-09 MED ORDER — CIPROFLOXACIN HCL 500 MG PO TABS: 500.0000 mg | ORAL_TABLET | Freq: Two times a day (BID) | ORAL | 0 refills | Status: AC

## 2018-11-09 NOTE — Addendum Note (Signed)
Addended by: Wallace Going on: 11/09/2018 10:13 AM   Modules accepted: Orders

## 2018-11-10 ENCOUNTER — Telehealth: Payer: Self-pay | Admitting: Plastic Surgery

## 2018-11-10 NOTE — Telephone Encounter (Signed)

## 2018-11-11 ENCOUNTER — Ambulatory Visit (INDEPENDENT_AMBULATORY_CARE_PROVIDER_SITE_OTHER): Payer: BC Managed Care – PPO | Admitting: Plastic Surgery

## 2018-11-11 ENCOUNTER — Encounter: Payer: Self-pay | Admitting: Plastic Surgery

## 2018-11-11 ENCOUNTER — Other Ambulatory Visit: Payer: Self-pay | Admitting: Plastic Surgery

## 2018-11-11 ENCOUNTER — Other Ambulatory Visit: Payer: Self-pay

## 2018-11-11 VITALS — BP 111/76 | HR 85 | Temp 98.1°F | Ht 69.0 in | Wt 218.4 lb

## 2018-11-11 DIAGNOSIS — N62 Hypertrophy of breast: Secondary | ICD-10-CM

## 2018-11-11 DIAGNOSIS — N6489 Other specified disorders of breast: Secondary | ICD-10-CM

## 2018-11-11 MED ORDER — CIPROFLOXACIN HCL 500 MG PO TABS: 500.0000 mg | ORAL_TABLET | Freq: Two times a day (BID) | ORAL | 0 refills | Status: AC

## 2018-11-11 NOTE — Addendum Note (Signed)
Addended by: Wallace Going on: 11/11/2018 04:36 PM   Modules accepted: Orders

## 2018-11-11 NOTE — Progress Notes (Signed)
The patient is a 53 year old female here for follow-up on her bilateral breast reduction.  She called over the weekend and asked for an antibiotic.  She was concerned about some redness of the right lower breast.  On exam it is a little bit red and a little bit indurated.  We were able to get 150 cc of serous fluid.  It does not look infected or purulent.  But due to the length of time that this has been filling with seroma we will send it for cultures.  I have asked her to continue with the Cipro and would like to see her back in a week.

## 2018-11-13 ENCOUNTER — Ambulatory Visit (INDEPENDENT_AMBULATORY_CARE_PROVIDER_SITE_OTHER): Payer: BC Managed Care – PPO | Admitting: Plastic Surgery

## 2018-11-13 ENCOUNTER — Other Ambulatory Visit: Payer: Self-pay

## 2018-11-13 ENCOUNTER — Encounter: Payer: Self-pay | Admitting: Plastic Surgery

## 2018-11-13 VITALS — BP 129/79 | HR 88 | Temp 98.0°F | Ht 69.0 in | Wt 218.0 lb

## 2018-11-13 DIAGNOSIS — N6489 Other specified disorders of breast: Secondary | ICD-10-CM

## 2018-11-13 NOTE — Progress Notes (Signed)
The patient is a 53 year old female here for follow-up on her bilateral breast reduction.  She has had some seromas.  Today she feels there may be more on the right side.  We were able to aspirate 30 cc of mostly serous fluid.  It did not look purulent.  We have already sent cultures.  There is a little bit of redness but the induration has improved.  She is to continue with the Cipro call me if any change.  And I like to see her back next week.

## 2018-11-16 LAB — AEROBIC/ANAEROBIC CULTURE W GRAM STAIN (SURGICAL/DEEP WOUND)

## 2018-11-18 ENCOUNTER — Encounter: Payer: Self-pay | Admitting: Plastic Surgery

## 2018-11-18 ENCOUNTER — Ambulatory Visit (HOSPITAL_COMMUNITY): Admission: RE | Admit: 2018-11-18 | Payer: BC Managed Care – PPO | Source: Ambulatory Visit

## 2018-11-18 ENCOUNTER — Other Ambulatory Visit (HOSPITAL_COMMUNITY): Payer: Self-pay | Admitting: Diagnostic Radiology

## 2018-11-18 ENCOUNTER — Ambulatory Visit (INDEPENDENT_AMBULATORY_CARE_PROVIDER_SITE_OTHER): Payer: BC Managed Care – PPO | Admitting: Plastic Surgery

## 2018-11-18 ENCOUNTER — Ambulatory Visit (HOSPITAL_COMMUNITY)
Admission: RE | Admit: 2018-11-18 | Discharge: 2018-11-18 | Disposition: A | Discharge status: Home / Self Care | Payer: BC Managed Care – PPO | Source: Ambulatory Visit | Attending: Diagnostic Radiology | Admitting: Diagnostic Radiology

## 2018-11-18 ENCOUNTER — Other Ambulatory Visit: Payer: Self-pay

## 2018-11-18 ENCOUNTER — Encounter (HOSPITAL_COMMUNITY): Payer: Self-pay | Admitting: *Deleted

## 2018-11-18 ENCOUNTER — Other Ambulatory Visit: Payer: Self-pay | Admitting: Plastic Surgery

## 2018-11-18 VITALS — BP 113/79 | HR 86 | Temp 97.3°F | Ht 67.0 in | Wt 221.2 lb

## 2018-11-18 DIAGNOSIS — N6001 Solitary cyst of right breast: Secondary | ICD-10-CM

## 2018-11-18 DIAGNOSIS — N6489 Other specified disorders of breast: Secondary | ICD-10-CM

## 2018-11-18 MED ORDER — CIPROFLOXACIN HCL 500 MG PO TABS: 500.0000 mg | ORAL_TABLET | Freq: Two times a day (BID) | ORAL | 0 refills | Status: DC

## 2018-11-18 MED ORDER — LIDOCAINE HCL (PF) 1 % IJ SOLN
INTRAMUSCULAR | Status: AC
  Filled 2018-11-18: qty 30

## 2018-11-18 NOTE — Progress Notes (Signed)
Phone call to pt. Instructions given regarding appt for ultrasound this afternoon. Bethany West, [place] tech aware. Pt verbalizes understanding of instructions.

## 2018-11-18 NOTE — Progress Notes (Signed)
   Subjective:    Patient ID: Bethany West, female    DOB: 1966-02-02, 53 y.o.   MRN: [id number]  The patient is a 53 year old female here for follow-up.  She had bilateral breast reduction.  She has had seroma several times that have been able to aspirate in the office.  Has a seroma on her right breast now with some redness.  It does not look cellulitic.  She does not have a fever.  We were able to send fluid last week which came back with Pseudomonas sensitive to the Cipro.  The redness is not any worse than her last exam.  It does not seem to be getting better ago.    Review of Systems  Constitutional: Negative.  Negative for activity change and appetite change.  Eyes: Negative.   Respiratory: Negative for chest tightness and shortness of breath.   Gastrointestinal: Negative.  Negative for abdominal pain.  Musculoskeletal: Negative for joint swelling.       Objective:   Physical Exam Vitals signs and nursing note reviewed.  Constitutional:      Appearance: Normal appearance.  HENT:     Head: Normocephalic and atraumatic.  Cardiovascular:     Rate and Rhythm: Normal rate.     Pulses: Normal pulses.  Pulmonary:     Effort: Pulmonary effort is normal.  Neurological:     General: No focal deficit present.     Mental Status: She is alert. Mental status is at baseline.  Psychiatric:        Mood and Affect: Mood normal.         Assessment & Plan:     ICD-10-CM   1. Seroma of breast  N64.89     Prescription sent to pharmacy for the Cipro.  Arranged for radiology to do ultrasound-guided aspiration and possible drain placement today.  Would like to see the patient back in 1 week. Pictures were obtained of the patient and placed in the chart with the patient's or guardian's permission.

## 2018-11-19 ENCOUNTER — Other Ambulatory Visit: Payer: BC Managed Care – PPO

## 2018-11-19 ENCOUNTER — Telehealth: Payer: Self-pay

## 2018-11-19 NOTE — Telephone Encounter (Signed)
Call to pt to assess status following drain placement under fluoro- by Dr. Jeronimo Norma at [facility] Pt states that drain was placed right breast - lateral lower area She reports drainage is approx 40 ml for 12hrs Drainage is noted to be "yellow to clear" now- but was yellow with blood clots following the procedure She continues to take antibiotics as ordered & she has a f/u with Dr. Marla Roe on 11/25/18- Pt did ask if Dr. Marla Roe will remove the drain or would she need to return to radiology Per Dr. Marla Roe- she will remove the drain in office Pt is reminded to call back for any questions or concerns [facility]

## 2018-11-21 ENCOUNTER — Encounter: Payer: Self-pay | Admitting: Plastic Surgery

## 2018-11-22 ENCOUNTER — Other Ambulatory Visit: Payer: Self-pay | Admitting: Surgical

## 2018-11-22 MED ORDER — CIPROFLOXACIN HCL 500 MG PO TABS: 500.0000 mg | ORAL_TABLET | Freq: Two times a day (BID) | ORAL | 0 refills | Status: DC

## 2018-11-22 MED ORDER — DOXYCYCLINE MONOHYDRATE 100 MG PO TABS: 100.0000 mg | ORAL_TABLET | Freq: Two times a day (BID) | ORAL | 0 refills | Status: AC

## 2018-11-22 NOTE — Progress Notes (Signed)
Error with messaging system. Patient having possible allergic reaction to ciprofloxacin, change rx to doxycycline BID x 5 days.

## 2018-11-22 NOTE — Progress Notes (Signed)
Patient needed prescription resent to pharmacy. Pharmacy had not received.

## 2018-11-24 ENCOUNTER — Telehealth: Payer: Self-pay | Admitting: Plastic Surgery

## 2018-11-24 NOTE — Telephone Encounter (Signed)

## 2018-11-24 NOTE — Progress Notes (Signed)
   Subjective:     Patient ID: Bethany West, female    DOB: 1965-11-16, 53 y.o.   MRN: [id number]  Chief Complaint  Patient presents with  . Follow-up    HPI: The patient is a 53 y.o. yrs old female here for follow up. She had a breast reduction which post-operatively required many in-office aspirations for seromas. After her last visit on 11/22/18 a drain was placed by IR. She reports < 10 cc of fluid in the last 2 days. She has been emptying it daily.  She had significant side effects from ciprofloxacin, including, but not limited to dizziness, significant myalgias and nearly fainting. She stopped that on Saturday and she was prescribed doxycycline over the weekend. The erythema on her right breast has significantly improved and nearly dissipated.   Today, she reports feeling a lot better. She did accidentally tug on her drain when it got caught in her fence at home, but it is still in place and draining some fluid.   Denies any fevers, chills, nausea/vomiting. No sign of infection, hematoma or seroma.  Review of Systems  Constitutional: Negative for chills, fever and malaise/fatigue.  HENT: Negative.   Eyes: Negative.   Respiratory: Negative.   Cardiovascular: Negative.   Gastrointestinal: Negative.   Genitourinary: Negative.   Musculoskeletal: Negative.   Skin: Negative for itching and rash.  Neurological: Negative for dizziness, tingling, sensory change, focal weakness, weakness and headaches.    Objective:   Vital Signs BP 130/85 (BP Location: Left Arm)   Pulse 89   Temp 98.5 F (36.9 C)   Ht 5\' 9"  (1.753 m)   Wt 216 lb (98 kg)   LMP 11/13/2001   SpO2 98%   BMI 31.90 kg/m  Vital Signs and Nursing Note Reviewed  Physical Exam  Constitutional: She is oriented to person, place, and time and well-developed, well-nourished, and in no distress. No distress.  HENT:  Head: Normocephalic and atraumatic.  Cardiovascular: Normal rate.  Pulmonary/Chest: Effort normal.   Drain in place on R side.   Neurological: She is alert and oriented to person, place, and time. Gait normal.  Skin: Skin is warm and dry. She is not diaphoretic. No erythema (improved since last visit.).  Psychiatric: Mood and affect normal.    Assessment/Plan:     ICD-10-CM   1. Seroma of breast  N64.89     Bethany West's condition has improved since her last visit. The erythema surrounding her R breast has significantly improved. Her drain output has been < 10 cc for the past 2 days. Due to her long history of seroma formation in this R breast, we are reluctant to remove the drain at this time despite low output. We would like to leave the drain for a few more days to avoid any more issues with seroma/hematoma formation.   We will see the patient back on Friday (3 days) and plan to remove the drain if there are no changes.  Overall, she is doing well. Doxycycline has improved her erythema and she reports no side effects.    Carola Rhine Lafern Brinkley, PA-C 11/25/2018, 3:15 PM

## 2018-11-25 ENCOUNTER — Other Ambulatory Visit: Payer: Self-pay

## 2018-11-25 ENCOUNTER — Encounter: Payer: Self-pay | Admitting: Surgical

## 2018-11-25 ENCOUNTER — Ambulatory Visit (INDEPENDENT_AMBULATORY_CARE_PROVIDER_SITE_OTHER): Payer: BC Managed Care – PPO | Admitting: Surgical

## 2018-11-25 VITALS — BP 130/85 | HR 89 | Temp 98.5°F | Ht 69.0 in | Wt 216.0 lb

## 2018-11-25 DIAGNOSIS — N6489 Other specified disorders of breast: Secondary | ICD-10-CM

## 2018-11-27 ENCOUNTER — Telehealth: Payer: Self-pay | Admitting: Plastic Surgery

## 2018-11-27 NOTE — Telephone Encounter (Signed)

## 2018-11-28 ENCOUNTER — Encounter: Payer: Self-pay | Admitting: Surgical

## 2018-11-28 ENCOUNTER — Encounter: Payer: Self-pay | Admitting: Plastic Surgery

## 2018-11-28 ENCOUNTER — Ambulatory Visit (INDEPENDENT_AMBULATORY_CARE_PROVIDER_SITE_OTHER): Payer: BC Managed Care – PPO | Admitting: Surgical

## 2018-11-28 ENCOUNTER — Other Ambulatory Visit: Payer: Self-pay

## 2018-11-28 VITALS — BP 120/81 | HR 81 | Temp 98.2°F | Ht 69.0 in | Wt 216.6 lb

## 2018-11-28 DIAGNOSIS — N6489 Other specified disorders of breast: Secondary | ICD-10-CM

## 2018-11-28 NOTE — Progress Notes (Signed)
   Subjective:     Patient ID: Bethany West, female    DOB: 03/19/1966, 53 y.o.   MRN: [id number]  Chief Complaint  Patient presents with  . Follow-up    for drain removal    HPI: The patient is a 53 y.o. yrs old female here for follow up after drain placement in her right breast. At her last visit, her output had been minimal. We left the drain in despite this low volume due to her chronic seroma formation in the right breast in the past.  Today she is doing well. Erythema is completely resolved. She does have some indurated areas that may be necrotic fat. No sign of seroma, hematoma or infection. Her drain output has continued to be less than 10 cc per 24 hours. She completed her doxycycline.   She has some scarring on the right breast at the inferior vertical limb, this may need a release.   Review of Systems  Constitutional: Negative for chills, diaphoresis and fever.  HENT: Negative.   Respiratory: Negative.   Cardiovascular: Negative.   Musculoskeletal: Negative.   Skin: Negative for itching and rash.       Negative for erythema   Neurological: Negative for dizziness and headaches.     Objective:   Vital Signs BP 120/81 (BP Location: Left Arm, Patient Position: Sitting, Cuff Size: Large)   Pulse 81   Temp 98.2 F (36.8 C) (Temporal)   Ht 5\' 9"  (1.753 m)   Wt 216 lb 9.6 oz (98.2 kg)   LMP 11/13/2001   SpO2 97%   BMI 31.99 kg/m  Vital Signs and Nursing Note Reviewed Chaperone present. Physical Exam  Constitutional: She is oriented to person, place, and time and well-developed, well-nourished, and in no distress. No distress.  HENT:  Head: Normocephalic.  Neck: Normal range of motion.  Cardiovascular: Normal rate.  Pulmonary/Chest: Effort normal.    Drain in place on R lateral breast. No sign of infection. Serosanguinous fluid in the bulb, ~ 10 cc.  Neurological: She is alert and oriented to person, place, and time. Gait normal.  Skin: Skin is warm and  dry. No rash noted. She is not diaphoretic. No erythema. No pallor.  Psychiatric: Mood and affect normal.      Assessment/Plan:     ICD-10-CM   1. Seroma of breast  N64.89    Bethany West is doing well. Drain removed. She is going to go back to work, she understands that she should avoid heavy lifting and strenuous movements to avoid injury wi  She may need a revision due to scarring on the inferior vertical limb of her right breast and possible fat necrosis on the right breast, she is aware this may require symmetry surgery and further reduction on the left and she is okay with that.   Carola Rhine Shakai Dolley, PA-C 11/28/2018, 11:45 AM

## 2018-12-11 ENCOUNTER — Encounter: Payer: Self-pay | Admitting: Surgical

## 2018-12-11 ENCOUNTER — Ambulatory Visit (INDEPENDENT_AMBULATORY_CARE_PROVIDER_SITE_OTHER): Payer: BC Managed Care – PPO | Admitting: Surgical

## 2018-12-11 ENCOUNTER — Other Ambulatory Visit: Payer: Self-pay

## 2018-12-11 DIAGNOSIS — N6489 Other specified disorders of breast: Secondary | ICD-10-CM

## 2018-12-11 NOTE — Progress Notes (Signed)
Mrs. Los is a 53 year old female who had a bilateral breast reduction on 10/02/2018 with Dr. Marla Roe.  This visit is a tele-visit over the phone.  She has had some difficulty postoperatively with seroma formation on the right side and required a drain to be placed by interventional radiology.  Her drain was removed on 11/28/2018 due to minimal output.  She is back to work and is currently in [place].  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  She reported that she has noticed that her right breast has some hard spots and has had some more pain.  She has not noticed any increase in the size or any swelling.  We have talked about a possible revision in the future and she reports that she would like to have more information about this.  She has not had any fevers or chills.  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  We will speak next week over the phone about a plan to have her come back for a revision and an office exam.

## 2018-12-29 ENCOUNTER — Telehealth: Payer: Self-pay

## 2018-12-29 NOTE — Telephone Encounter (Signed)

## 2018-12-30 ENCOUNTER — Other Ambulatory Visit: Payer: Self-pay

## 2018-12-30 ENCOUNTER — Encounter: Payer: Self-pay | Admitting: Plastic Surgery

## 2018-12-30 ENCOUNTER — Ambulatory Visit (INDEPENDENT_AMBULATORY_CARE_PROVIDER_SITE_OTHER): Payer: BC Managed Care – PPO | Admitting: Plastic Surgery

## 2018-12-30 DIAGNOSIS — N6489 Other specified disorders of breast: Secondary | ICD-10-CM | POA: Insufficient documentation

## 2018-12-30 NOTE — Progress Notes (Signed)
   Subjective:    Patient ID: Bethany West, female    DOB: 11/16/1965, 53 y.o.   MRN: [id number]  The patient is a 53 yrs old wf here for follow up on her breast reduction surgery.  She had a persistent seroma of the right breast.  This has resolved after the drain was placed.  No sign of infection.  There is no redness or swelling.  There is no a contracture of the inferior portion of the breast due to the seroma.  She has significant asymmery.    Review of Systems  Constitutional: Positive for activity change.  HENT: Negative.   Eyes: Negative.   Respiratory: Negative.   Cardiovascular: Negative.   Gastrointestinal: Negative.   Endocrine: Negative.   Genitourinary: Negative.   Musculoskeletal: Negative.   Psychiatric/Behavioral: Negative.        Objective:   Physical Exam Vitals signs and nursing note reviewed.  Constitutional:      Appearance: Normal appearance.  HENT:     Head: Normocephalic and atraumatic.  Cardiovascular:     Rate and Rhythm: Normal rate.  Pulmonary:     Effort: Pulmonary effort is normal.  Neurological:     General: No focal deficit present.     Mental Status: She is alert and oriented to person, place, and time.  Psychiatric:        Mood and Affect: Mood normal.        Behavior: Behavior normal.        Thought Content: Thought content normal.        Assessment & Plan:     ICD-10-CM   1. Postoperative breast asymmetry  N64.89     Pictures were obtained of the patient and placed in the chart with the patient's or guardian's permission.  Will need to do revision for symmetry and release of the right breast capsule contracture from the seroma.

## 2019-02-09 ENCOUNTER — Ambulatory Visit (INDEPENDENT_AMBULATORY_CARE_PROVIDER_SITE_OTHER): Payer: BC Managed Care – PPO | Admitting: Nurse Practitioner

## 2019-02-09 ENCOUNTER — Encounter: Payer: Self-pay | Admitting: Nurse Practitioner

## 2019-02-09 ENCOUNTER — Other Ambulatory Visit: Payer: Self-pay

## 2019-02-09 VITALS — BP 146/86 | HR 78 | Temp 98.3°F | Ht 69.0 in | Wt 219.0 lb

## 2019-02-09 DIAGNOSIS — N6489 Other specified disorders of breast: Secondary | ICD-10-CM

## 2019-02-09 MED ORDER — CEPHALEXIN 500 MG PO CAPS: 500.0000 mg | ORAL_CAPSULE | Freq: Four times a day (QID) | ORAL | 0 refills | Status: AC

## 2019-02-09 MED ORDER — HYDROCODONE-ACETAMINOPHEN 5-325 MG PO TABS: 1.0000 | ORAL_TABLET | Freq: Four times a day (QID) | ORAL | 0 refills | Status: DC | PRN
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
## 2019-02-10 ENCOUNTER — Telehealth: Payer: Self-pay | Admitting: Plastic Surgery

## 2019-02-10 NOTE — Telephone Encounter (Signed)
Patient called to cancel surgery. She has decided that no one is going to see any issues with her breasts, so she feels like she can live with the asymmetry.

## 2019-02-14 ENCOUNTER — Other Ambulatory Visit (HOSPITAL_COMMUNITY): Payer: BC Managed Care – PPO

## 2019-02-18 ENCOUNTER — Encounter (HOSPITAL_BASED_OUTPATIENT_CLINIC_OR_DEPARTMENT_OTHER): Payer: Self-pay

## 2019-02-18 ENCOUNTER — Ambulatory Visit (HOSPITAL_BASED_OUTPATIENT_CLINIC_OR_DEPARTMENT_OTHER): Admit: 2019-02-18 | Payer: BC Managed Care – PPO | Admitting: Plastic Surgery

## 2019-02-18 SURGERY — MAMMOPLASTY, REDUCTION
Anesthesia: General | Site: Breast | Laterality: Bilateral

## 2019-02-23 ENCOUNTER — Other Ambulatory Visit (HOSPITAL_COMMUNITY): Payer: BC Managed Care – PPO

## 2019-02-27 ENCOUNTER — Encounter: Payer: BC Managed Care – PPO | Admitting: Surgical

## 2019-03-06 ENCOUNTER — Encounter: Payer: BC Managed Care – PPO | Admitting: Surgical

## 2020-03-10 IMAGING — US US IMAGE GUIDED FLUID DRAIN BY CATHETER
1 series · 13 of 20 positions shown · non-contrast
Comparison: none

INDICATION: 52-year-old with history of breast reduction surgery. Recurrent
fluid collection in the right breast tissue. Fluid has been
aspirated and culture was positive for Pseudomonas. Request for
image guided right breast drain.

[Series 1: us image guided fluid drain by catheter · 13 of 20 slices shown]
[im 1/20]
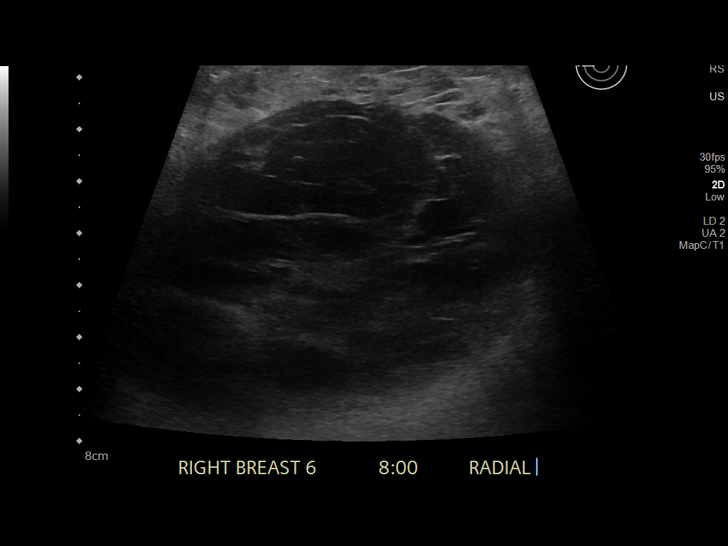
[im 3/20]
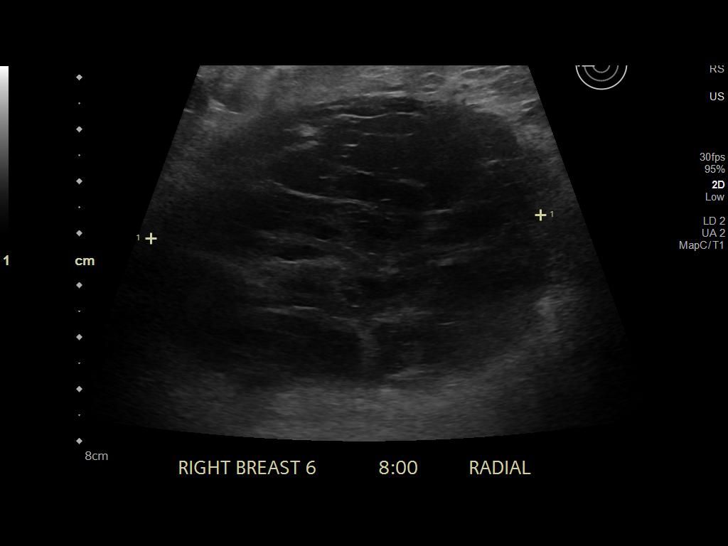
[im 4/20]
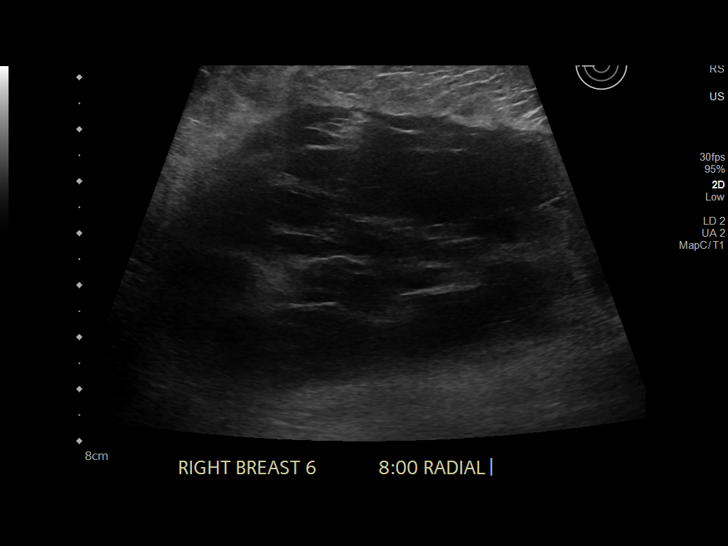
[im 6/20]
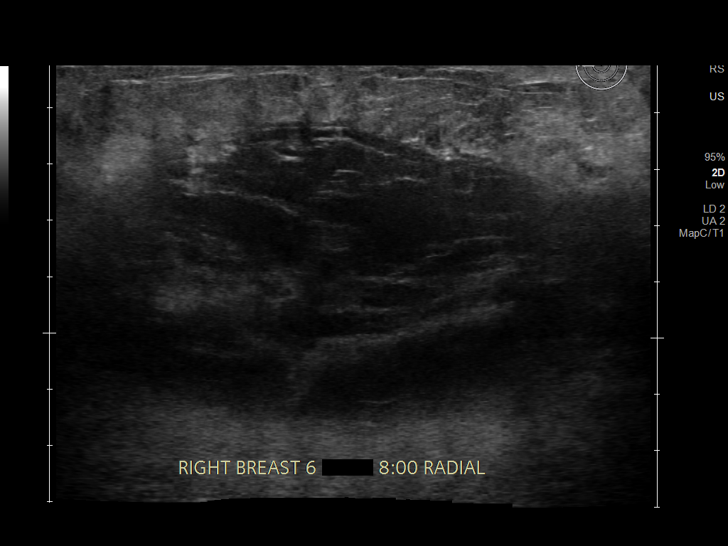
[im 7/20]
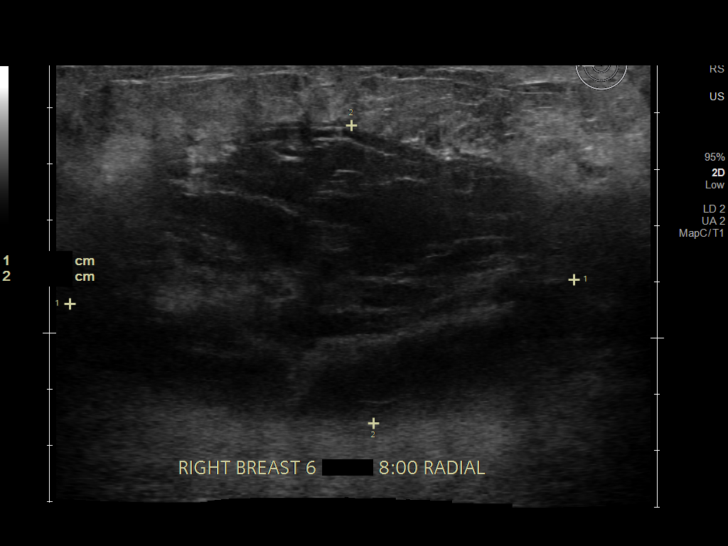
[im 9/20]
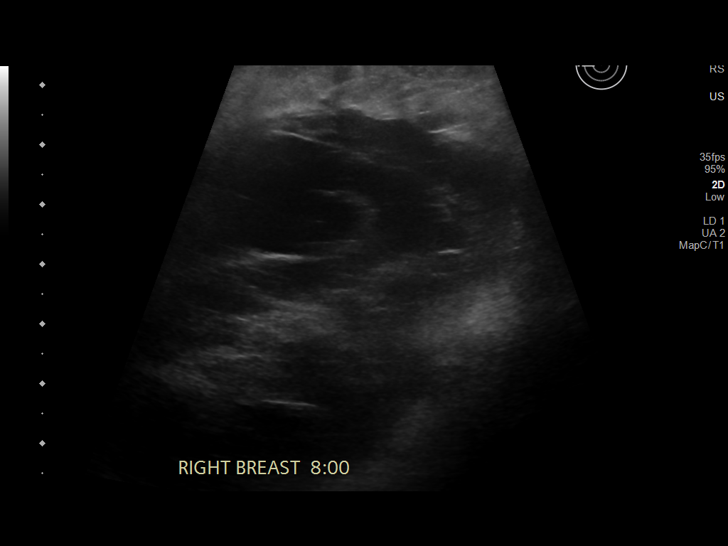
[im 11/20]
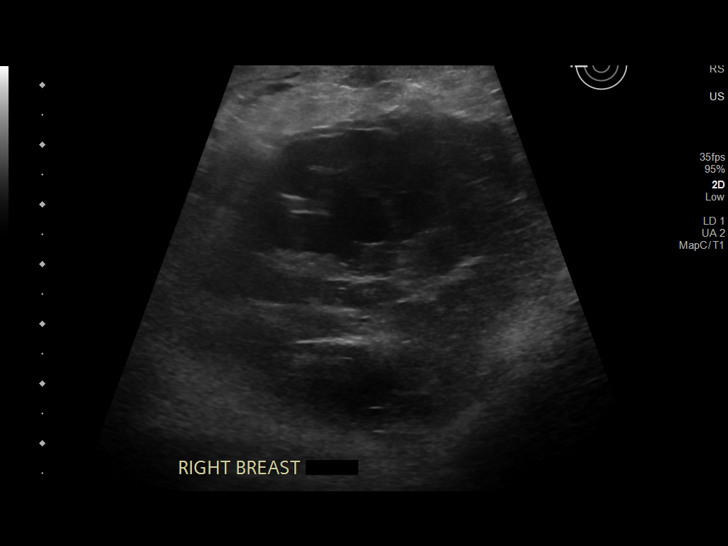
[im 12/20]
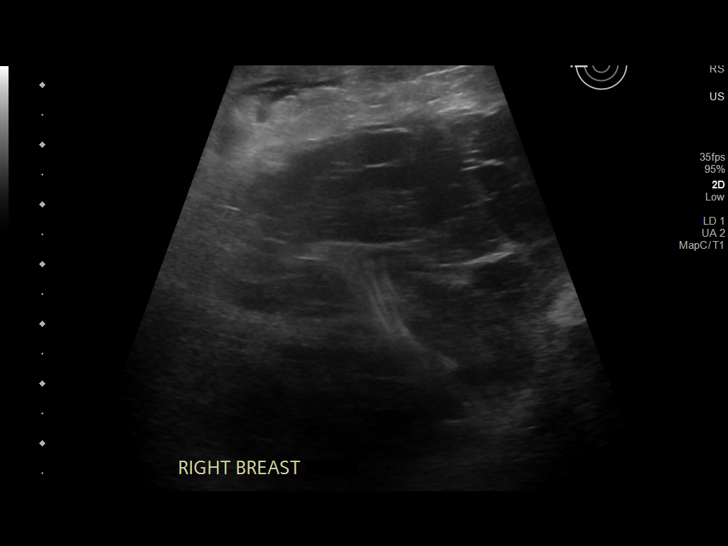
[im 14/20]
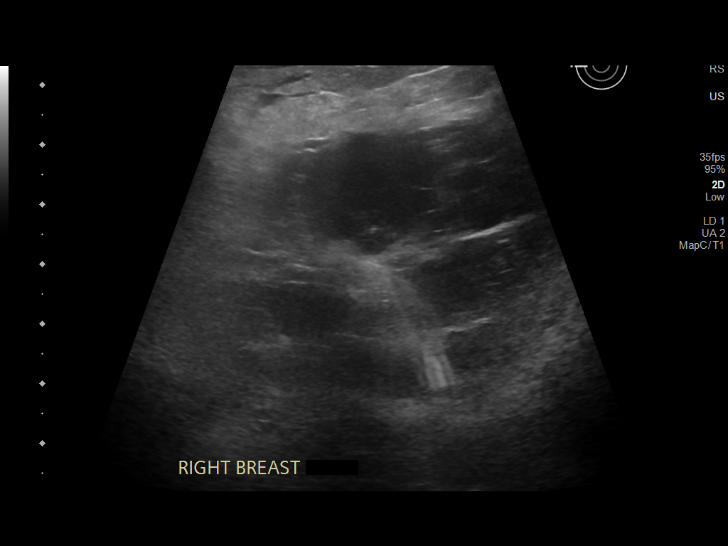
[im 15/20]
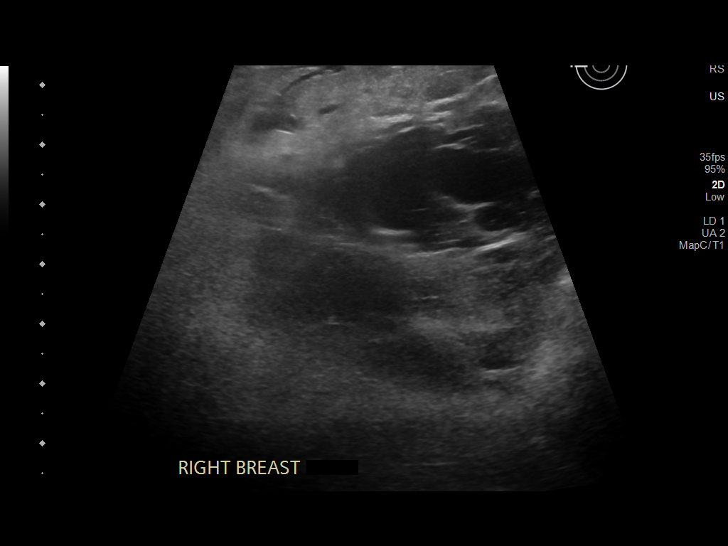
[im 17/20]
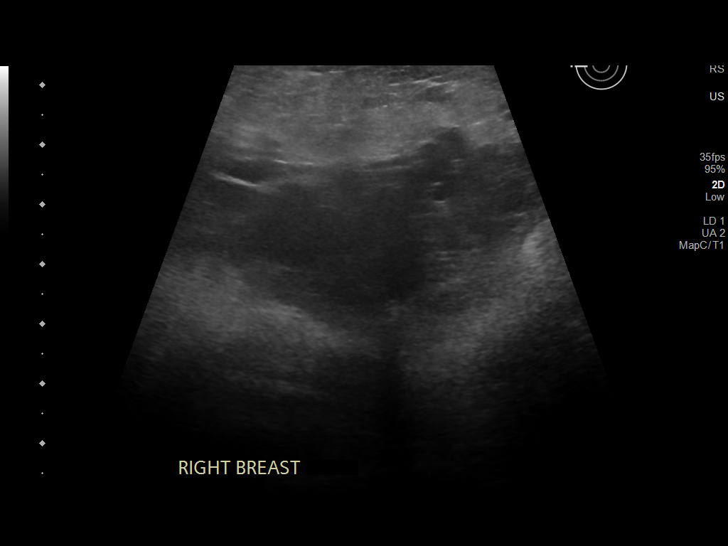
[im 18/20]
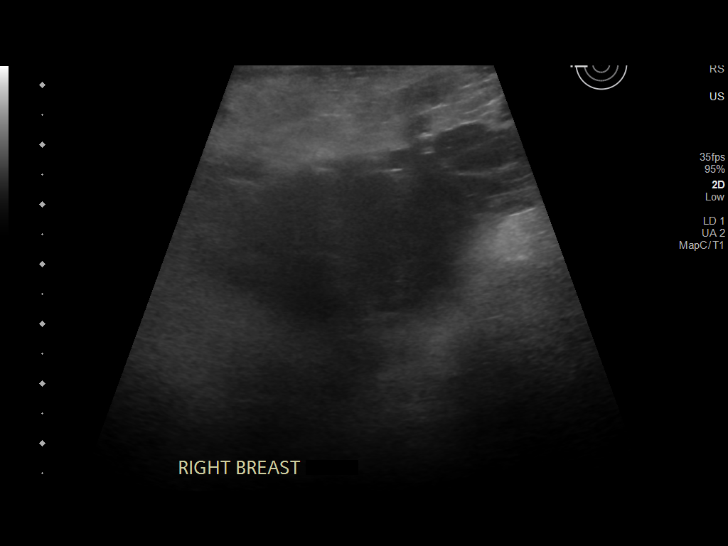
[im 20/20]
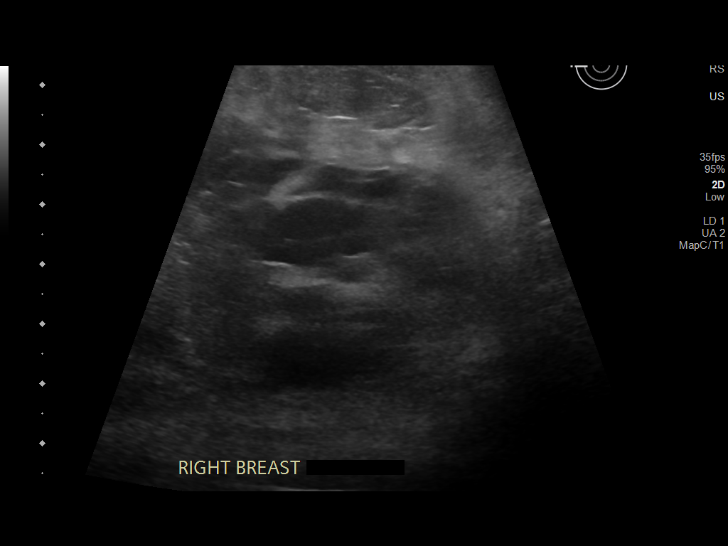

[13 of 20 positions shown; findings below may reference images not displayed]

EXAM:
ULTRASOUND-GUIDED PLACEMENT OF DRAIN IN RIGHT BREAST ABSCESS

MEDICATIONS:
Patient is on oral antibiotics.

ANESTHESIA/SEDATION:
None

COMPLICATIONS:
None immediate.

PROCEDURE:
Informed written consent was obtained from the patient after a
thorough discussion of the procedural risks, benefits and
alternatives. A timeout was performed prior to the initiation of the
procedure.

The right breast was examined with ultrasound. Breast tissue is hard
and indurated. Complex fluid collection was identified along the
right inferior aspect of the breast tissue. Additional areas in the
right breast were heterogeneous but the largest pocket was targeted.
The lateral aspect of the right breast was prepped with
chlorhexidine and sterile field was created. Skin was anesthetized
with 1% lidocaine. 18 gauge trocar needle was directed into the
complex fluid collection using ultrasound guidance. Stiff Amplatz
wire was advanced into the collection. Tract was dilated to
accommodate a 10.2 French multipurpose drain. The drain did not
reconstitute easily into the cavity on the first attempt. Drain was
redirected into the breast tissue and successfully coiled within the
complex fluid collection. Approximately 25 mL of cloudy yellow fluid
was aspirated from the collection. Catheter was sutured to skin with
0 prolene and attached to a suction bulb. Dressing was placed over
the drain.
FINDINGS: Complex fluid collection along the right lateral inferior breast
tissue. Fluid is very loculated. Largest fluid collection is noted
at the 8 o'clock position and measured 9.0 x 5.3 x 7.5 cm. Complex
fluid collection decreased in size following drain placement but
there were residual septated collections remaining after drain
placement.
IMPRESSION: Ultrasound-guided placement of a drainage catheter within the right
breast abscess. The right breast abscess/collection is very complex
with multiple septations. 25 mL of cloudy yellow fluid was removed
from the collection.

## 2022-06-18 DIAGNOSIS — Z0000 Encounter for general adult medical examination without abnormal findings: Secondary | ICD-10-CM | POA: Diagnosis not present

## 2022-06-18 DIAGNOSIS — Z1329 Encounter for screening for other suspected endocrine disorder: Secondary | ICD-10-CM | POA: Diagnosis not present

## 2022-06-18 DIAGNOSIS — Z13220 Encounter for screening for lipoid disorders: Secondary | ICD-10-CM | POA: Diagnosis not present

## 2022-06-18 DIAGNOSIS — Z131 Encounter for screening for diabetes mellitus: Secondary | ICD-10-CM | POA: Diagnosis not present

## 2022-06-19 DIAGNOSIS — Z0000 Encounter for general adult medical examination without abnormal findings: Secondary | ICD-10-CM | POA: Diagnosis not present

## 2022-06-20 DIAGNOSIS — M130 Polyarthritis, unspecified: Secondary | ICD-10-CM | POA: Diagnosis not present

## 2022-06-20 DIAGNOSIS — D509 Iron deficiency anemia, unspecified: Secondary | ICD-10-CM | POA: Diagnosis not present

## 2022-07-14 DIAGNOSIS — Z7982 Long term (current) use of aspirin: Secondary | ICD-10-CM | POA: Diagnosis not present

## 2022-07-14 DIAGNOSIS — I10 Essential (primary) hypertension: Secondary | ICD-10-CM | POA: Diagnosis not present

## 2022-07-14 DIAGNOSIS — R1011 Right upper quadrant pain: Secondary | ICD-10-CM | POA: Diagnosis not present

## 2022-07-14 DIAGNOSIS — Z9049 Acquired absence of other specified parts of digestive tract: Secondary | ICD-10-CM | POA: Diagnosis not present

## 2022-07-14 DIAGNOSIS — R1013 Epigastric pain: Secondary | ICD-10-CM | POA: Diagnosis not present

## 2022-07-14 DIAGNOSIS — R0789 Other chest pain: Secondary | ICD-10-CM | POA: Diagnosis not present

## 2022-07-14 DIAGNOSIS — R079 Chest pain, unspecified: Secondary | ICD-10-CM | POA: Diagnosis not present

## 2022-07-14 DIAGNOSIS — Z79899 Other long term (current) drug therapy: Secondary | ICD-10-CM | POA: Diagnosis not present

## 2022-07-14 DIAGNOSIS — K838 Other specified diseases of biliary tract: Secondary | ICD-10-CM | POA: Diagnosis not present

## 2022-07-14 DIAGNOSIS — M1990 Unspecified osteoarthritis, unspecified site: Secondary | ICD-10-CM | POA: Diagnosis not present

## 2022-07-14 DIAGNOSIS — R9431 Abnormal electrocardiogram [ECG] [EKG]: Secondary | ICD-10-CM | POA: Diagnosis not present

## 2022-07-14 DIAGNOSIS — K760 Fatty (change of) liver, not elsewhere classified: Secondary | ICD-10-CM | POA: Diagnosis not present

## 2022-07-14 DIAGNOSIS — Z888 Allergy status to other drugs, medicaments and biological substances status: Secondary | ICD-10-CM | POA: Diagnosis not present

## 2022-07-16 DIAGNOSIS — R197 Diarrhea, unspecified: Secondary | ICD-10-CM | POA: Diagnosis not present

## 2022-07-16 DIAGNOSIS — R1011 Right upper quadrant pain: Secondary | ICD-10-CM | POA: Diagnosis not present

## 2022-09-19 DIAGNOSIS — M130 Polyarthritis, unspecified: Secondary | ICD-10-CM | POA: Diagnosis not present

## 2022-10-15 DIAGNOSIS — M62830 Muscle spasm of back: Secondary | ICD-10-CM | POA: Diagnosis not present

## 2023-07-15 ENCOUNTER — Encounter: Payer: Self-pay | Admitting: Internal Medicine

## 2023-07-15 ENCOUNTER — Ambulatory Visit: Payer: Medicaid Other | Admitting: Internal Medicine

## 2023-07-15 VITALS — BP 142/82 | HR 75 | Temp 97.0°F | Resp 18 | Ht 68.0 in | Wt 243.0 lb

## 2023-07-15 DIAGNOSIS — I10 Essential (primary) hypertension: Secondary | ICD-10-CM | POA: Insufficient documentation

## 2023-07-15 DIAGNOSIS — M2550 Pain in unspecified joint: Secondary | ICD-10-CM | POA: Diagnosis not present

## 2023-07-15 DIAGNOSIS — Z6836 Body mass index (BMI) 36.0-36.9, adult: Secondary | ICD-10-CM

## 2023-07-15 DIAGNOSIS — R7303 Prediabetes: Secondary | ICD-10-CM | POA: Insufficient documentation

## 2023-07-15 DIAGNOSIS — G4733 Obstructive sleep apnea (adult) (pediatric): Secondary | ICD-10-CM | POA: Insufficient documentation

## 2023-07-15 MED ORDER — MAGNESIUM OXIDE -MG SUPPLEMENT 400 (240 MG) MG PO TABS: 1.0000 | ORAL_TABLET | Freq: Every day | ORAL | 6 refills | Status: AC

## 2023-07-15 NOTE — Assessment & Plan Note (Signed)
 Her BP medicine were reviewed and I will continue on current medications.

## 2023-07-15 NOTE — Assessment & Plan Note (Signed)
 She take meloxicam and tylenol as needed

## 2023-07-15 NOTE — Assessment & Plan Note (Signed)
She snore loudly, toss and turn at night and do not feel fresh when she get up in the morning. I will refer her for sleep study

## 2023-07-15 NOTE — Progress Notes (Addendum)
 New Patient Office Visit  Subjective    Patient ID: Bethany West, female    DOB: 16-Apr-1966  Age: 57 y.o. MRN: [id number]  CC: No chief complaint on file.   HPI Bethany West presents to establish care with [place]. She has hypertension, borderline diabetes mellitus. She has medicaid and her PCP does not take medicaid so she is here to establish care with [place]. She does not check her BP at home.   She has OSA diagnosed in 2019 and has not been using CPAP but she ran out of tubing and has stopped using it.   She is trying 2 pounds, she says she has lost 10 pounds since May 16 2023. Her COVID test was negative.   Outpatient Encounter Medications as of 07/15/2023  Medication Sig   magnesium oxide (MAG-OX) 400 (240 Mg) MG tablet Take 1 tablet by mouth daily.   acetaminophen (TYLENOL) 325 MG tablet Take 650 mg by mouth every 6 (six) hours as needed for pain.   albuterol (VENTOLIN HFA) 108 (90 Base) MCG/ACT inhaler Inhale into the lungs.   Ascorbic Acid (VITAMIN C) 100 MG tablet Take 100 mg by mouth daily.   aspirin EC 81 MG tablet Take by mouth.   baclofen (LIORESAL) 10 MG tablet Take 10 mg by mouth 2 (two) times daily.   bisoprolol-hydrochlorothiazide (ZIAC) 2.5-6.25 MG tablet Take 2 tablets by mouth daily.   cetirizine (ZYRTEC) 10 MG tablet Take 10 mg by mouth daily.   clobetasol cream (TEMOVATE) 0.05 % Apply 1 application topically 2 (two) times daily.   EPINEPHrine (EPIPEN IJ) Inject as directed.   HYDROcodone-acetaminophen (NORCO/VICODIN) 5-325 MG tablet Take 1 tablet by mouth every 6 (six) hours as needed for up to 10 doses for moderate pain.   losartan (COZAAR) 25 MG tablet Take 25 mg by mouth daily.   Melatonin 10 MG TABS Take 1 tablet by mouth as needed.   meloxicam (MOBIC) 15 MG tablet Take 15 mg by mouth daily.   Multiple Vitamin (MULTI-VITAMIN DAILY PO) Take 1 tablet by mouth daily.   ondansetron (ZOFRAN-ODT) 4 MG disintegrating tablet As needed.   pantoprazole  (PROTONIX) 40 MG tablet Take 40 mg by mouth daily.   [DISCONTINUED] amLODipine (NORVASC) 5 MG tablet Take 5 mg by mouth daily.   [DISCONTINUED] ibuprofen (ADVIL) 800 MG tablet Take by mouth.   No facility-administered encounter medications on file as of 07/15/2023.    Past Medical History:  Diagnosis Date   Anemia    took iron in the past, borderline with increased iron in diet and vit c   Eosinophilic esophagitis due to food    pt carries epi pen in case she ingests egg products   GERD (gastroesophageal reflux disease)    no meds, well controlled with diet   Hypertension    Kidney stone 2008   Pancreatitis     Past Surgical History:  Procedure Laterality Date   ABDOMINAL HYSTERECTOMY     ANKLE RECONSTRUCTION Right    APPENDECTOMY     BREAST LUMPECTOMY Bilateral    benign cysts   BREAST REDUCTION SURGERY Bilateral 10/02/2018   Procedure: MAMMARY REDUCTION  (BREAST);  Surgeon: Peggye Form, DO;  Location: [facility];  Service: Plastics;  Laterality: Bilateral;   CHOLECYSTECTOMY     KNEE ARTHROSCOPY Right 2018   x4   LUMBAR DISC SURGERY  2007   MOUTH SURGERY     teeth extractions   TONSILLECTOMY  WRIST RECONSTRUCTION Right     No family history on file.  Social History   Socioeconomic History   Marital status: Divorced    Spouse name: Not on file   Number of children: Not on file   Years of education: Not on file   Highest education level: Not on file  Occupational History   Not on file  Tobacco Use   Smoking status: Former    Current packs/day: 0.00    Types: Cigarettes    Quit date: 2014    Years since quitting: 11.1   Smokeless tobacco: Never   Tobacco comments:    smoked for 30 years  Vaping Use   Vaping status: Never Used  Substance and Sexual Activity   Alcohol use: Yes    Alcohol/week: 1.0 standard drink of alcohol    Types: 1 Glasses of wine per week    Comment: occassionally   Drug use: Never   Sexual activity: Not  Currently  Other Topics Concern   Not on file  Social History Narrative   Not on file   Social Drivers of Health   Financial Resource Strain: Not on file  Food Insecurity: Not on file  Transportation Needs: Not on file  Physical Activity: Not on file  Stress: Not on file  Social Connections: Not on file  Intimate Partner Violence: Not on file    Review of Systems  Constitutional: Negative.   HENT: Negative.    Respiratory: Negative.    Cardiovascular: Negative.   Gastrointestinal: Negative.   Neurological: Negative.         Objective    BP (!) 142/82 (BP Location: Left Arm, Patient Position: Sitting, Cuff Size: Normal)   Pulse 75   Temp (!) 97 F (36.1 C)   Resp 18   Ht 5\' 8"  (1.727 m)   Wt 243 lb (110.2 kg)   LMP 11/13/2001   SpO2 95%   BMI 36.95 kg/m   Physical Exam Constitutional:      Appearance: Normal appearance.  HENT:     Head: Normocephalic and atraumatic.  Cardiovascular:     Rate and Rhythm: Normal rate and regular rhythm.     Heart sounds: Normal heart sounds.  Pulmonary:     Effort: Pulmonary effort is normal.     Breath sounds: Normal breath sounds.  Abdominal:     General: Bowel sounds are normal.     Palpations: Abdomen is soft.  Neurological:     General: No focal deficit present.     Mental Status: She is alert and oriented to person, place, and time.         Assessment & Plan:   Problem List Items Addressed This Visit       Cardiovascular and Mediastinum   Hypertension - Primary   Her BP medicine were reviewed and I will continue on current medications.       Relevant Medications   bisoprolol-hydrochlorothiazide (ZIAC) 2.5-6.25 MG tablet   losartan (COZAAR) 25 MG tablet     Respiratory   OSA (obstructive sleep apnea)   I will refer her for sleep study      Relevant Orders   Ambulatory referral to Sleep Studies     Other   Arthralgia   She take meloxicam and tylenol as needed      Borderline diabetes  mellitus   Relevant Orders   CMP14 + Anion Gap   Hemoglobin A1c   Lipid panel    Return in about 3  months (around 10/15/2023).   Eloisa Northern, MD

## 2023-07-17 ENCOUNTER — Ambulatory Visit: Admitting: Internal Medicine

## 2023-07-17 DIAGNOSIS — R7303 Prediabetes: Secondary | ICD-10-CM | POA: Diagnosis not present

## 2023-07-17 NOTE — Progress Notes (Signed)
 Nurse visit  Blood draw

## 2023-07-18 LAB — CMP14 + ANION GAP
ALT: 23 IU/L (ref 0–32)
AST: 19 IU/L (ref 0–40)
Albumin: 4.6 g/dL (ref 3.8–4.9)
Alkaline Phosphatase: 87 IU/L (ref 44–121)
Anion Gap: 15 mmol/L (ref 10.0–18.0)
BUN/Creatinine Ratio: 20 (ref 9–23)
BUN: 17 mg/dL (ref 6–24)
Bilirubin Total: 0.4 mg/dL (ref 0.0–1.2)
CO2: 26 mmol/L (ref 20–29)
Calcium: 10.1 mg/dL (ref 8.7–10.2)
Chloride: 100 mmol/L (ref 96–106)
Creatinine, Ser: 0.83 mg/dL (ref 0.57–1.00)
Globulin, Total: 2 g/dL (ref 1.5–4.5)
Glucose: 89 mg/dL (ref 70–99)
Potassium: 4.1 mmol/L (ref 3.5–5.2)
Sodium: 141 mmol/L (ref 134–144)
Total Protein: 6.6 g/dL (ref 6.0–8.5)
eGFR: 83 mL/min/{1.73_m2} (ref >59)

## 2023-07-18 LAB — LIPID PANEL
Chol/HDL Ratio: 3 ratio (ref 0.0–4.4)
Cholesterol, Total: 149 mg/dL (ref 100–199)
HDL: 49 mg/dL (ref >39)
LDL Chol Calc (NIH): 77 mg/dL (ref 0–99)
Triglycerides: 128 mg/dL (ref 0–149)
VLDL Cholesterol Cal: 23 mg/dL (ref 5–40)

## 2023-07-18 LAB — HEMOGLOBIN A1C
Est. average glucose Bld gHb Est-mCnc: 114 mg/dL
Hgb A1c MFr Bld: 5.6 % (ref 4.8–5.6)

## 2023-07-26 NOTE — Progress Notes (Signed)
 Patient called.  Patient aware. Her labs are normal.

## 2023-09-05 DIAGNOSIS — G4733 Obstructive sleep apnea (adult) (pediatric): Secondary | ICD-10-CM | POA: Diagnosis not present

## 2023-09-05 DIAGNOSIS — R5383 Other fatigue: Secondary | ICD-10-CM | POA: Diagnosis not present

## 2023-09-05 DIAGNOSIS — Z87891 Personal history of nicotine dependence: Secondary | ICD-10-CM | POA: Diagnosis not present

## 2023-09-05 DIAGNOSIS — R400 Somnolence: Secondary | ICD-10-CM | POA: Diagnosis not present

## 2023-10-03 DIAGNOSIS — G4733 Obstructive sleep apnea (adult) (pediatric): Secondary | ICD-10-CM | POA: Diagnosis not present

## 2023-10-14 ENCOUNTER — Ambulatory Visit: Admitting: Internal Medicine

## 2023-10-14 ENCOUNTER — Encounter: Payer: Self-pay | Admitting: Internal Medicine

## 2023-10-14 VITALS — BP 132/82 | HR 77 | Temp 97.3°F | Resp 18 | Ht 68.0 in | Wt 237.4 lb

## 2023-10-14 DIAGNOSIS — N951 Menopausal and female climacteric states: Secondary | ICD-10-CM | POA: Insufficient documentation

## 2023-10-14 DIAGNOSIS — R7303 Prediabetes: Secondary | ICD-10-CM | POA: Diagnosis not present

## 2023-10-14 DIAGNOSIS — G4733 Obstructive sleep apnea (adult) (pediatric): Secondary | ICD-10-CM | POA: Diagnosis not present

## 2023-10-14 DIAGNOSIS — I10 Essential (primary) hypertension: Secondary | ICD-10-CM | POA: Diagnosis not present

## 2023-10-14 MED ORDER — PAROXETINE HCL ER 12.5 MG PO TB24: 12.5000 mg | ORAL_TABLET | Freq: Every day | ORAL | 2 refills | Status: AC

## 2023-10-14 NOTE — Assessment & Plan Note (Signed)
 Her blood pressure is controlled.

## 2023-10-14 NOTE — Assessment & Plan Note (Signed)
 I will start her on Paxil CR 12.5 mg daily.  This will help with her anxiety and vasomotor symptoms.

## 2023-10-14 NOTE — Assessment & Plan Note (Signed)
 She has sleep study done and waiting for result.

## 2023-10-14 NOTE — Assessment & Plan Note (Signed)
 She has lost 5 more lb.  Her hemoglobin A1c is 5.6%.

## 2023-10-14 NOTE — Progress Notes (Signed)
 Office Visit  Subjective   Patient ID: Bethany West   DOB: 05-12-1966   Age: 58 y.o.   MRN: [id number]   Chief Complaint Chief Complaint  Patient presents with   Follow-up    3 month follow up     History of Present Illness   58 years old female is here for follow-up.  She says that she has sleep study done but have not heard back about the result.  She has obstructive sleep apnea but has not been using CPAP.   She has hypertension, her blood pressure is controlled.  She take losartan 25 mg daily and be so probe low all/hydrochlorothiazide 2.5/6.25 mg 2 tablets daily.  I have reviewed blood test that was done on last visit with her.    She has borderline diabetes mellitus.  On her last blood draw her hemoglobin A1c was 5.6%.  She will continue to watch his diet.    She says that she has lost couple of lb but gain better again because she is under a lot of stress because her father has small cell lung cancer with metastatic assist and mother has Alzheimer disease and she is caregiver for both of them.     She is also complaining of hot flashes particularly at nighttime.  She has hysterectomy and left ovary was also removed.  Past Medical History Past Medical History:  Diagnosis Date   Anemia    took iron in the past, borderline with increased iron in diet and vit c   Eosinophilic esophagitis due to food    pt carries epi pen in case she ingests egg products   GERD (gastroesophageal reflux disease)    no meds, well controlled with diet   Hypertension    Kidney stone 2008   Pancreatitis      Allergies Allergies  Allergen Reactions   Ciprofloxacin  Other (See Comments)    Patient experienced significant myalgias, general malaise, dizziness, faintness.   Egg-Derived Products Anaphylaxis   Sulfa Antibiotics Swelling    Swelling all over   Other Swelling     Swelling of esophagus. Multiple food allergies, onion, green peas, green beans, october beans, green pepers,  tomatoes, cabbage, yellow squash, lima beans, tree nuts, sesame seeds   Sumatriptan Nausea And Vomiting   Codeine Nausea Only   Hydrocodone -Acetaminophen  Nausea Only and Other (See Comments)    Headaches   Percocet [Oxycodone -Acetaminophen ] Itching     Review of Systems Review of Systems  Constitutional: Negative.   HENT: Negative.    Respiratory: Negative.    Cardiovascular: Negative.   Gastrointestinal: Negative.   Neurological: Negative.   Psychiatric/Behavioral:  The patient is nervous/anxious.        Objective:    Vitals BP 132/82   Pulse 77   Temp (!) 97.3 F (36.3 C)   Resp 18   Ht 5\' 8"  (1.727 m)   Wt 237 lb 6 oz (107.7 kg)   LMP 11/13/2001   SpO2 98%   BMI 36.09 kg/m    Physical Examination Physical Exam Constitutional:      Appearance: Normal appearance. She is obese.  HENT:     Head: Normocephalic and atraumatic.  Cardiovascular:     Rate and Rhythm: Normal rate and regular rhythm.     Heart sounds: Normal heart sounds.  Pulmonary:     Effort: Pulmonary effort is normal.     Breath sounds: Normal breath sounds.  Abdominal:     General: Bowel sounds are  normal.     Palpations: Abdomen is soft.  Neurological:     General: No focal deficit present.     Mental Status: She is alert and oriented to person, place, and time.        Assessment & Plan:   Hypertension   Her blood pressure is controlled.  OSA (obstructive sleep apnea)   She has sleep study done and waiting for result.  Vasomotor symptoms due to menopause   I will start her on Paxil CR 12.5 mg daily.  This will help with her anxiety and vasomotor symptoms.  Borderline diabetes mellitus   She has lost 5 more lb.  Her hemoglobin A1c is 5.6%.    Return in about 3 months (around 01/14/2024).   Tita Form, MD

## 2023-12-03 ENCOUNTER — Other Ambulatory Visit: Payer: Self-pay

## 2023-12-03 MED ORDER — BACLOFEN 10 MG PO TABS: 10.0000 mg | ORAL_TABLET | Freq: Two times a day (BID) | ORAL | 1 refills | Status: DC

## 2023-12-03 MED ORDER — MELOXICAM 15 MG PO TABS: 15.0000 mg | ORAL_TABLET | Freq: Every day | ORAL | 1 refills | Status: DC

## 2024-01-07 ENCOUNTER — Other Ambulatory Visit: Payer: Self-pay | Admitting: Internal Medicine

## 2024-01-14 ENCOUNTER — Other Ambulatory Visit: Payer: Self-pay

## 2024-01-14 MED ORDER — BISOPROLOL-HYDROCHLOROTHIAZIDE 2.5-6.25 MG PO TABS: 2.0000 | ORAL_TABLET | Freq: Every day | ORAL | 0 refills | Status: DC

## 2024-01-14 MED ORDER — LOSARTAN POTASSIUM 25 MG PO TABS: 25.0000 mg | ORAL_TABLET | Freq: Every day | ORAL | 0 refills | Status: DC

## 2024-01-17 ENCOUNTER — Other Ambulatory Visit: Payer: Self-pay | Admitting: Medical Genetics

## 2024-01-20 ENCOUNTER — Ambulatory Visit: Admitting: Internal Medicine

## 2024-01-27 DIAGNOSIS — G4733 Obstructive sleep apnea (adult) (pediatric): Secondary | ICD-10-CM | POA: Diagnosis not present

## 2024-01-31 ENCOUNTER — Other Ambulatory Visit: Payer: Self-pay | Admitting: Internal Medicine

## 2024-02-10 ENCOUNTER — Other Ambulatory Visit: Payer: Self-pay | Admitting: Internal Medicine

## 2024-02-14 ENCOUNTER — Other Ambulatory Visit: Payer: Self-pay | Admitting: Internal Medicine

## 2024-02-14 MED ORDER — BISOPROLOL-HYDROCHLOROTHIAZIDE 2.5-6.25 MG PO TABS: 2.0000 | ORAL_TABLET | Freq: Every day | ORAL | 0 refills | Status: DC

## 2024-02-14 MED ORDER — MELOXICAM 15 MG PO TABS: 15.0000 mg | ORAL_TABLET | Freq: Every day | ORAL | 0 refills | Status: DC

## 2024-02-17 ENCOUNTER — Ambulatory Visit: Admitting: Internal Medicine

## 2024-02-17 ENCOUNTER — Encounter: Payer: Self-pay | Admitting: Internal Medicine

## 2024-02-17 VITALS — BP 124/82 | HR 72 | Temp 97.8°F | Resp 18 | Ht 68.0 in | Wt 238.0 lb

## 2024-02-17 DIAGNOSIS — R0789 Other chest pain: Secondary | ICD-10-CM | POA: Insufficient documentation

## 2024-02-17 DIAGNOSIS — G4733 Obstructive sleep apnea (adult) (pediatric): Secondary | ICD-10-CM | POA: Diagnosis not present

## 2024-02-17 DIAGNOSIS — R7303 Prediabetes: Secondary | ICD-10-CM

## 2024-02-17 DIAGNOSIS — I10 Essential (primary) hypertension: Secondary | ICD-10-CM | POA: Diagnosis not present

## 2024-02-17 MED ORDER — PANTOPRAZOLE SODIUM 40 MG PO TBEC: 40.0000 mg | DELAYED_RELEASE_TABLET | Freq: Every day | ORAL | 6 refills | Status: AC

## 2024-02-17 MED ORDER — PHENTERMINE HCL 15 MG PO CAPS: 15.0000 mg | ORAL_CAPSULE | ORAL | 1 refills | Status: DC

## 2024-02-17 NOTE — Assessment & Plan Note (Signed)
 She could not use CPAP and she will discuss with sleep doctor.

## 2024-02-17 NOTE — Progress Notes (Signed)
 Office Visit  Subjective   Patient ID: Bethany West   DOB: April 19, 1966   Age: 58 y.o.   MRN: [id number]   Chief Complaint Chief Complaint  Patient presents with   Hypertension   Follow-up     History of Present Illness 58 years old female is here for follow-up.  She says that she has chest pressure often on.  She has chest pressure right now it is mild nonradiating.  She says that she has stress test few years ago.  She attributes eating hot dogs for chest pressure.   She says that she has OSA and she could not use mask and she will see sleep doctor to discuss other options.     She has hypertension, her blood pressure is controlled.  She take losartan  25 mg daily and be so probe low all/hydrochlorothiazide  2.5/6.25 mg 2 tablets daily.     She has borderline diabetes mellitus.  On her last blood draw her hemoglobin A1c was 5.6% on July 17, 2023.  She will continue to watch his diet.    She has lost 5 lb but she says that she struggled to lose weight.  She tried to watch her diet.   Her weight is 238 lb with BMI of 36.     She is also complaining of hot flashes particularly at nighttime.   I have started her on Paxil  but she says that she read the drug interactions so she does not want to take it. She has hysterectomy and left ovary was also removed.  Past Medical History Past Medical History:  Diagnosis Date   Anemia    took iron in the past, borderline with increased iron in diet and vit c   Eosinophilic esophagitis due to food    pt carries epi pen in case she ingests egg products   GERD (gastroesophageal reflux disease)    no meds, well controlled with diet   Hypertension    Kidney stone 2008   Pancreatitis      Allergies Allergies  Allergen Reactions   Ciprofloxacin  Other (See Comments)    Patient experienced significant myalgias, general malaise, dizziness, faintness.   Egg-Derived Products Anaphylaxis   Sulfa Antibiotics Swelling    Swelling all over   Other  Swelling     Swelling of esophagus. Multiple food allergies, onion, green peas, green beans, october beans, green pepers, tomatoes, cabbage, yellow squash, lima beans, tree nuts, sesame seeds   Sumatriptan Nausea And Vomiting   Codeine Nausea Only   Hydrocodone -Acetaminophen  Nausea Only and Other (See Comments)    Headaches   Percocet [Oxycodone -Acetaminophen ] Itching     Review of Systems Review of Systems  Constitutional: Negative.   HENT: Negative.    Respiratory: Negative.    Cardiovascular:  Positive for chest pain.  Gastrointestinal: Negative.   Neurological: Negative.        Objective:    Vitals BP 124/82   Pulse 72   Temp 97.8 F (36.6 C)   Resp 18   Ht 5' 8 (1.727 m)   Wt 238 lb (108 kg)   LMP 11/13/2001   SpO2 98%   BMI 36.19 kg/m    Physical Examination Physical Exam Constitutional:      Appearance: Normal appearance.  HENT:     Head: Normocephalic and atraumatic.  Eyes:     Extraocular Movements: Extraocular movements intact.     Pupils: Pupils are equal, round, and reactive to light.  Cardiovascular:  Rate and Rhythm: Normal rate and regular rhythm.     Heart sounds: Normal heart sounds.  Pulmonary:     Effort: Pulmonary effort is normal.     Breath sounds: Normal breath sounds.  Abdominal:     General: Bowel sounds are normal.     Palpations: Abdomen is soft.  Neurological:     General: No focal deficit present.     Mental Status: She is alert and oriented to person, place, and time.        Assessment & Plan:   Hypertension  Her blood pressure is controlled.  OSA (obstructive sleep apnea)   She could not use CPAP and she will discuss with sleep doctor.  Morbid obesity (HCC)   Her BMI is 36 with underlying hypertension make it morbidly obese.  Chest pressure   Her EKG shows normal sinus rhythm with no ischemia.  She will continue with pantoprazole.  Borderline diabetes mellitus   Her hemoglobin A1c was 5.6 so she will  continue to watch her diet.    Return in about 1 month (around 03/19/2024).   Roetta Dare, MD

## 2024-02-17 NOTE — Assessment & Plan Note (Signed)
 Her hemoglobin A1c was 5.6 so she will continue to watch her diet.

## 2024-02-17 NOTE — Assessment & Plan Note (Signed)
 Her blood pressure is controlled.

## 2024-02-17 NOTE — Assessment & Plan Note (Signed)
 Her BMI is 36 with underlying hypertension make it morbidly obese.

## 2024-02-17 NOTE — Assessment & Plan Note (Signed)
 Her EKG shows normal sinus rhythm with no ischemia.  She will continue with pantoprazole.

## 2024-02-23 ENCOUNTER — Other Ambulatory Visit: Payer: Self-pay | Admitting: Internal Medicine

## 2024-02-24 ENCOUNTER — Encounter (HOSPITAL_COMMUNITY): Payer: Self-pay

## 2024-03-08 DIAGNOSIS — G4733 Obstructive sleep apnea (adult) (pediatric): Secondary | ICD-10-CM | POA: Diagnosis not present

## 2024-03-14 ENCOUNTER — Other Ambulatory Visit: Payer: Self-pay | Admitting: Internal Medicine

## 2024-03-16 ENCOUNTER — Encounter: Payer: Self-pay | Admitting: Internal Medicine

## 2024-03-16 ENCOUNTER — Ambulatory Visit: Admitting: Internal Medicine

## 2024-03-16 VITALS — BP 124/80 | HR 78 | Temp 97.0°F | Resp 18 | Ht 68.0 in | Wt 230.5 lb

## 2024-03-16 DIAGNOSIS — I10 Essential (primary) hypertension: Secondary | ICD-10-CM

## 2024-03-16 DIAGNOSIS — R7303 Prediabetes: Secondary | ICD-10-CM | POA: Diagnosis not present

## 2024-03-16 DIAGNOSIS — M546 Pain in thoracic spine: Secondary | ICD-10-CM

## 2024-03-16 DIAGNOSIS — G4733 Obstructive sleep apnea (adult) (pediatric): Secondary | ICD-10-CM

## 2024-03-16 DIAGNOSIS — G8929 Other chronic pain: Secondary | ICD-10-CM

## 2024-03-16 MED ORDER — MELOXICAM 15 MG PO TABS: 15.0000 mg | ORAL_TABLET | Freq: Every day | ORAL | 0 refills | Status: DC

## 2024-03-16 MED ORDER — LOSARTAN POTASSIUM 25 MG PO TABS: 25.0000 mg | ORAL_TABLET | Freq: Every day | ORAL | 0 refills | Status: DC

## 2024-03-16 NOTE — Assessment & Plan Note (Signed)
 She will try to use CPAP at night.

## 2024-03-16 NOTE — Assessment & Plan Note (Signed)
 Her BMI is 35 with underlying hypertension make it morbidly obese.

## 2024-03-16 NOTE — Progress Notes (Signed)
 Office Visit  Subjective   Patient ID: Bethany West   DOB: 05-30-1965   Age: 58 y.o.   MRN: [id number]   Chief Complaint Chief Complaint  Patient presents with   Follow-up    1 month follow up     History of Present Illness 58 years old female is here for follow-up.   She says that she came back from a honeymoon and she ate a lot in spite of that she has lost 8 more lb.  Her weight today is 230 lb with BMI of 35. She takes phentermine 15 mg daily without any side effect.  Her pulse and blood pressure is good.    She says that she has OSA and she tries to use CPAP at nighttime but sometime after he get up go to bathroom when she does not use that.    She has hypertension, her blood pressure is controlled.  She take losartan  25 mg daily and bisoprolol /hydrochlorothiazide  2.5/6.25 mg 2 tablets daily.     She has borderline diabetes mellitus.  On her last blood draw her hemoglobin A1c was 5.6% on July 17, 2023.  She will continue to watch his diet.     She also has back pain and neck pain and she take meloxicam  as needed.  After back surgery her pain is overall better.     Past Medical History Past Medical History:  Diagnosis Date   Anemia    took iron in the past, borderline with increased iron in diet and vit c   Eosinophilic esophagitis due to food    pt carries epi pen in case she ingests egg products   GERD (gastroesophageal reflux disease)    no meds, well controlled with diet   Hypertension    Kidney stone 2008   Pancreatitis      Allergies Allergies  Allergen Reactions   Ciprofloxacin  Other (See Comments)    Patient experienced significant myalgias, general malaise, dizziness, faintness.   Egg Protein-Containing Drug Products Anaphylaxis   Sulfa Antibiotics Swelling    Swelling all over   Other Swelling     Swelling of esophagus. Multiple food allergies, onion, green peas, green beans, october beans, green pepers, tomatoes, cabbage, yellow squash, lima  beans, tree nuts, sesame seeds   Sumatriptan Nausea And Vomiting   Codeine Nausea Only   Hydrocodone -Acetaminophen  Nausea Only and Other (See Comments)    Headaches   Percocet [Oxycodone -Acetaminophen ] Itching     Review of Systems Review of Systems  Constitutional: Negative.   HENT: Negative.    Respiratory: Negative.    Cardiovascular: Negative.   Gastrointestinal: Negative.   Neurological: Negative.        Objective:    Vitals BP 124/80   Pulse 78   Temp (!) 97 F (36.1 C)   Resp 18   Ht 5' 8 (1.727 m)   Wt 230 lb 8 oz (104.6 kg)   LMP 11/13/2001   SpO2 98%   BMI 35.05 kg/m    Physical Examination Physical Exam Constitutional:      Appearance: Normal appearance.  HENT:     Head: Normocephalic and atraumatic.  Cardiovascular:     Rate and Rhythm: Normal rate and regular rhythm.     Heart sounds: Normal heart sounds.  Pulmonary:     Effort: Pulmonary effort is normal.     Breath sounds: Normal breath sounds.  Neurological:     Mental Status: She is alert.  Assessment & Plan:   Hypertension   Her blood pressure is controlled.  OSA (obstructive sleep apnea)   She will try to use CPAP at night.  Morbid obesity (HCC)   Her BMI is 35 with underlying hypertension make it morbidly obese.    Return in about 3 months (around 06/16/2024).   Roetta Dare, MD

## 2024-03-16 NOTE — Assessment & Plan Note (Signed)
 Her blood pressure is controlled.

## 2024-03-19 ENCOUNTER — Other Ambulatory Visit: Payer: Self-pay | Admitting: Internal Medicine

## 2024-04-06 ENCOUNTER — Other Ambulatory Visit: Payer: Self-pay | Admitting: Internal Medicine

## 2024-04-08 DIAGNOSIS — G4733 Obstructive sleep apnea (adult) (pediatric): Secondary | ICD-10-CM | POA: Diagnosis not present

## 2024-04-13 ENCOUNTER — Ambulatory Visit: Admitting: Internal Medicine

## 2024-04-13 ENCOUNTER — Encounter: Payer: Self-pay | Admitting: Internal Medicine

## 2024-04-13 VITALS — BP 122/80 | HR 87 | Temp 97.6°F | Resp 18 | Ht 69.0 in | Wt 230.5 lb

## 2024-04-13 DIAGNOSIS — L309 Dermatitis, unspecified: Secondary | ICD-10-CM | POA: Insufficient documentation

## 2024-04-13 DIAGNOSIS — M5441 Lumbago with sciatica, right side: Secondary | ICD-10-CM | POA: Diagnosis not present

## 2024-04-13 DIAGNOSIS — R21 Rash and other nonspecific skin eruption: Secondary | ICD-10-CM | POA: Diagnosis not present

## 2024-04-13 NOTE — Progress Notes (Signed)
 Office Visit  Subjective   Patient ID: GERLENE GLASSBURN   DOB: Jun 03, 1965   Age: 58 y.o.   MRN: [id number]   Chief Complaint Chief Complaint  Patient presents with   office visit    Patient here for referral and possible meds     History of Present Illness 58 years old femle is here complaining of rash in her mid back.  She says that she can not see the rash but it itch a lot.  She has scratch marks in her mid back.  She has eczema to her earlobe and she wanted to see dermatologist.    She also is complaining of back pain that radiates to her right side and hip area.  She says that she has a surgery done in [place] many years ago and she feels like that this is the same pain that has flared up.  She wanted to see same orthopedic surgeon in [place].  Past Medical History Past Medical History:  Diagnosis Date   Anemia    took iron in the past, borderline with increased iron in diet and vit c   Eosinophilic esophagitis due to food    pt carries epi pen in case she ingests egg products   GERD (gastroesophageal reflux disease)    no meds, well controlled with diet   Hypertension    Kidney stone 2008   Pancreatitis      Allergies Allergies  Allergen Reactions   Ciprofloxacin  Other (See Comments)    Patient experienced significant myalgias, general malaise, dizziness, faintness.   Egg Protein-Containing Drug Products Anaphylaxis   Sulfa Antibiotics Swelling    Swelling all over   Other Swelling     Swelling of esophagus. Multiple food allergies, onion, green peas, green beans, october beans, green pepers, tomatoes, cabbage, yellow squash, lima beans, tree nuts, sesame seeds   Sumatriptan Nausea And Vomiting   Codeine Nausea Only   Hydrocodone -Acetaminophen  Nausea Only and Other (See Comments)    Headaches   Percocet [Oxycodone -Acetaminophen ] Itching     Review of Systems Review of Systems  Respiratory: Negative.    Cardiovascular: Negative.    Musculoskeletal:  Positive for back pain.  Skin:  Positive for itching and rash.       Objective:    Vitals BP 122/80   Pulse 87   Temp 97.6 F (36.4 C)   Resp 18   Ht 5' 9 (1.753 m)   Wt 230 lb 8 oz (104.6 kg)   LMP 11/13/2001   SpO2 97%   BMI 34.04 kg/m    Physical Examination Physical Exam Constitutional:      Appearance: Normal appearance.  HENT:     Ears:     Comments:   She has irritated area in her right ear. Pulmonary:     Effort: Pulmonary effort is normal.     Breath sounds: Normal breath sounds.  Neurological:     General: No focal deficit present.     Mental Status: She is alert and oriented to person, place, and time.        Assessment & Plan:   Acute midline low back pain with right-sided sciatica   She wanted to see the same Orthopedics/spine surgeon in [place] who did surgery.  I will refer her to see spine surgeon.  Eczema   She has eczema to her right ear.  She has more irritated area in her back and I have advised her to use some lotion and referral to  see dermatologist.    No follow-ups on file.   Roetta Dare, MD

## 2024-04-16 ENCOUNTER — Other Ambulatory Visit: Payer: Self-pay | Admitting: Internal Medicine

## 2024-04-18 NOTE — Assessment & Plan Note (Signed)
 She has eczema to her right ear.  She has more irritated area in her back and I have advised her to use some lotion and referral to see dermatologist.

## 2024-04-18 NOTE — Assessment & Plan Note (Signed)
 She wanted to see the same Orthopedics/spine surgeon in [place] who did surgery.  I will refer her to see spine surgeon.

## 2024-04-21 ENCOUNTER — Other Ambulatory Visit: Payer: Self-pay | Admitting: Internal Medicine

## 2024-04-24 ENCOUNTER — Other Ambulatory Visit: Payer: Self-pay

## 2024-04-24 MED ORDER — BISOPROLOL-HYDROCHLOROTHIAZIDE 2.5-6.25 MG PO TABS: 2.0000 | ORAL_TABLET | Freq: Every day | ORAL | 0 refills | Status: DC

## 2024-04-24 MED ORDER — MELOXICAM 15 MG PO TABS: 15.0000 mg | ORAL_TABLET | Freq: Every day | ORAL | 0 refills | Status: DC

## 2024-04-24 NOTE — Progress Notes (Signed)
 Refilled Meloxicam  and Bisoprolol -Hyrdroclorothiazide to [facility] on [address]

## 2024-04-28 ENCOUNTER — Other Ambulatory Visit: Payer: Self-pay | Admitting: Internal Medicine

## 2024-04-28 MED ORDER — PHENTERMINE HCL 15 MG PO CAPS: 15.0000 mg | ORAL_CAPSULE | ORAL | 1 refills | Status: AC

## 2024-05-06 ENCOUNTER — Other Ambulatory Visit: Payer: Self-pay | Admitting: Internal Medicine

## 2024-05-16 ENCOUNTER — Other Ambulatory Visit: Payer: Self-pay | Admitting: Internal Medicine

## 2024-05-21 ENCOUNTER — Other Ambulatory Visit: Payer: Self-pay | Admitting: Internal Medicine

## 2024-06-01 ENCOUNTER — Other Ambulatory Visit: Payer: Self-pay | Admitting: Internal Medicine

## 2024-06-01 ENCOUNTER — Ambulatory Visit

## 2024-06-01 VITALS — BP 130/82 | HR 78 | Temp 98.2°F | Resp 16 | Ht 68.0 in | Wt 232.4 lb

## 2024-06-01 DIAGNOSIS — H938X3 Other specified disorders of ear, bilateral: Secondary | ICD-10-CM | POA: Insufficient documentation

## 2024-06-01 DIAGNOSIS — J069 Acute upper respiratory infection, unspecified: Secondary | ICD-10-CM | POA: Insufficient documentation

## 2024-06-01 DIAGNOSIS — R051 Acute cough: Secondary | ICD-10-CM | POA: Insufficient documentation

## 2024-06-01 MED ORDER — PREDNISONE 20 MG PO TABS: 20.0000 mg | ORAL_TABLET | Freq: Two times a day (BID) | ORAL | 0 refills | Status: AC

## 2024-06-01 MED ORDER — AZITHROMYCIN 500 MG PO TABS: 500.0000 mg | ORAL_TABLET | Freq: Every day | ORAL | 0 refills | Status: AC

## 2024-06-01 MED ORDER — AZITHROMYCIN 250 MG PO TABS: ORAL_TABLET | ORAL | 0 refills | Status: AC

## 2024-06-01 NOTE — Assessment & Plan Note (Signed)
 Take Arithromycin 500 mg day 1 then 250 the next four days.

## 2024-06-01 NOTE — Progress Notes (Addendum)
 "  Established Patient Office Visit  Subjective   Patient ID: Bethany West, female    DOB: [birth year]  Age: 59 y.o. MRN: [id number]  Chief Complaint  Patient presents with   Cough    Pt in today for a cough. The patient reports on Wednesday night she started having a non-productive cough, sore throat (from coughing), sinus pressure, and chest congestion. The patient reports she has taken Mucinex with some relief and was prescribed a Albuterol inhaler from [facility] with no relief.     Patient is a 59 year old female who presents in office for follow up upper respiratory symptoms. She was recently seen in the urgent care, where was tested for flu and covid which turned out to be negative. She reports the symptoms have steadily worsened in the days since been the urgent care. She denies fever but currently has a cough/ congestion that is present since last Wednesday. She was prescribed an inhaler from the provider at urgent care but it has not provider her relief. She notes the coughing is worse when lying down. She has an adjustable bed that she keeps the head of bed raised. She is concerned about being around her father who has a weakened immune system from cancer. She was tested for covid and flu in office for which she tested negative as well. She takes Halls and Mucinex for her symptoms but states they provide minimal relief for her symptoms  Cough This is a new problem. The current episode started in the past 7 days. The problem has been gradually worsening. The problem occurs constantly. The cough is Non-productive. Associated symptoms include chest pain, ear pain, headaches, nasal congestion, rhinorrhea, a sore throat, shortness of breath and wheezing. The symptoms are aggravated by lying down. She has tried a beta-agonist inhaler and OTC cough suppressant for the symptoms. The treatment provided mild relief. Her past medical history is significant for bronchitis  and pneumonia.      Review of Systems  Constitutional:  Positive for malaise/fatigue.  HENT:  Positive for congestion, ear pain, rhinorrhea, sinus pain and sore throat.   Eyes: Negative.   Respiratory:  Positive for cough, shortness of breath and wheezing.   Cardiovascular:  Positive for chest pain.  Gastrointestinal: Negative.   Genitourinary: Negative.   Musculoskeletal: Negative.   Skin: Negative.   Neurological:  Positive for headaches.  Endo/Heme/Allergies: Negative.   Psychiatric/Behavioral: Negative.        Objective:     BP 130/82   Pulse 78   Temp 98.2 F (36.8 C) (Temporal)   Resp 16   Ht 5' 8 (1.727 m)   Wt 232 lb 6.4 oz (105.4 kg)   LMP 11/13/2001   SpO2 97%   BMI 35.34 kg/m    Physical Exam Constitutional:      Appearance: She is obese.  HENT:     Right Ear: Tympanic membrane is bulging.     Nose: Congestion and rhinorrhea present.     Mouth/Throat:     Mouth: Mucous membranes are moist.  Cardiovascular:     Rate and Rhythm: Normal rate and regular rhythm.     Pulses: Normal pulses.     Heart sounds: Normal heart sounds.  Pulmonary:     Breath sounds: Decreased air movement present. Examination of the right-middle field reveals decreased breath sounds. Examination of the left-middle field reveals decreased breath sounds. Examination of the right-lower field reveals decreased breath sounds. Examination of the  left-lower field reveals decreased breath sounds. Decreased breath sounds present.  Abdominal:     General: Abdomen is flat. Bowel sounds are normal.  Musculoskeletal:        General: Normal range of motion.     Cervical back: Normal range of motion.  Skin:    General: Skin is warm and dry.  Neurological:     General: No focal deficit present.     Mental Status: She is alert and oriented to person, place, and time.  Psychiatric:        Mood and Affect: Mood normal.        Behavior: Behavior normal.        Thought Content: Thought  content normal.        Judgment: Judgment normal.      No results found for any visits on 06/01/24.    The 10-year ASCVD risk score (Arnett DK, et al., 2019) is: 2.8%    Assessment & Plan:   Problem List Items Addressed This Visit       Respiratory   Viral upper respiratory tract infection - Primary   Take Arithromycin 500 mg day 1 then 250 the next four days.      Relevant Medications   tacrolimus (PROTOPIC) 0.1 % ointment     Other   Acute cough   Take Prednisone  20 mg BID x 5 days      Congestion of both ears    No follow-ups on file.    Austine Cork, FNP  "

## 2024-06-01 NOTE — Assessment & Plan Note (Signed)
 Take Prednisone  20 mg BID x 5 days

## 2024-06-04 ENCOUNTER — Other Ambulatory Visit: Payer: Self-pay | Admitting: Medical Genetics

## 2024-06-04 DIAGNOSIS — Z006 Encounter for examination for normal comparison and control in clinical research program: Secondary | ICD-10-CM

## 2024-06-15 ENCOUNTER — Ambulatory Visit: Admitting: Internal Medicine

## 2024-06-19 ENCOUNTER — Ambulatory Visit
